# Patient Record
Sex: Male | Born: 1985 | Race: White | Hispanic: No | Marital: Single | Smoking: Never smoker
Health system: Southern US, Community
[De-identification: ages and names within clinical notes are randomized; demographics above are authoritative.]

## PROBLEM LIST (undated history)

## (undated) DIAGNOSIS — G809 Cerebral palsy, unspecified: Secondary | ICD-10-CM

## (undated) DIAGNOSIS — R569 Unspecified convulsions: Secondary | ICD-10-CM

## (undated) HISTORY — PX: ABDOMINAL SURGERY: SHX537

---
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
## 2008-05-26 ENCOUNTER — Emergency Department: Payer: Self-pay | Admitting: Emergency Medicine

## 2008-06-17 ENCOUNTER — Ambulatory Visit: Payer: Self-pay | Admitting: Neurology

## 2008-07-08 ENCOUNTER — Ambulatory Visit: Payer: Self-pay | Admitting: Internal Medicine

## 2008-07-21 ENCOUNTER — Encounter: Payer: Self-pay | Admitting: Family Medicine

## 2008-08-04 ENCOUNTER — Encounter: Payer: Self-pay | Admitting: Family Medicine

## 2012-12-11 ENCOUNTER — Ambulatory Visit: Payer: Self-pay | Admitting: Neurology

## 2013-10-13 ENCOUNTER — Ambulatory Visit: Payer: Self-pay | Admitting: Neurology

## 2015-05-11 DIAGNOSIS — H903 Sensorineural hearing loss, bilateral: Secondary | ICD-10-CM | POA: Diagnosis not present

## 2015-05-20 DIAGNOSIS — R42 Dizziness and giddiness: Secondary | ICD-10-CM | POA: Diagnosis not present

## 2015-05-25 DIAGNOSIS — G40909 Epilepsy, unspecified, not intractable, without status epilepticus: Secondary | ICD-10-CM | POA: Diagnosis not present

## 2015-05-25 DIAGNOSIS — R42 Dizziness and giddiness: Secondary | ICD-10-CM | POA: Diagnosis not present

## 2015-05-25 DIAGNOSIS — H8111 Benign paroxysmal vertigo, right ear: Secondary | ICD-10-CM | POA: Diagnosis not present

## 2015-05-31 DIAGNOSIS — F0632 Mood disorder due to known physiological condition with major depressive-like episode: Secondary | ICD-10-CM | POA: Diagnosis not present

## 2015-06-22 DIAGNOSIS — H6982 Other specified disorders of Eustachian tube, left ear: Secondary | ICD-10-CM | POA: Diagnosis not present

## 2015-06-27 DIAGNOSIS — Z0000 Encounter for general adult medical examination without abnormal findings: Secondary | ICD-10-CM | POA: Diagnosis not present

## 2015-06-27 DIAGNOSIS — J4540 Moderate persistent asthma, uncomplicated: Secondary | ICD-10-CM | POA: Diagnosis not present

## 2015-06-27 DIAGNOSIS — J309 Allergic rhinitis, unspecified: Secondary | ICD-10-CM | POA: Diagnosis not present

## 2015-06-28 DIAGNOSIS — F0632 Mood disorder due to known physiological condition with major depressive-like episode: Secondary | ICD-10-CM | POA: Diagnosis not present

## 2015-07-10 DIAGNOSIS — J45901 Unspecified asthma with (acute) exacerbation: Secondary | ICD-10-CM | POA: Diagnosis not present

## 2015-07-10 DIAGNOSIS — R69 Illness, unspecified: Secondary | ICD-10-CM | POA: Diagnosis not present

## 2015-07-26 DIAGNOSIS — F0632 Mood disorder due to known physiological condition with major depressive-like episode: Secondary | ICD-10-CM | POA: Diagnosis not present

## 2015-07-27 DIAGNOSIS — J4540 Moderate persistent asthma, uncomplicated: Secondary | ICD-10-CM | POA: Diagnosis not present

## 2015-07-27 DIAGNOSIS — J309 Allergic rhinitis, unspecified: Secondary | ICD-10-CM | POA: Diagnosis not present

## 2015-07-27 DIAGNOSIS — H1045 Other chronic allergic conjunctivitis: Secondary | ICD-10-CM | POA: Diagnosis not present

## 2015-07-27 DIAGNOSIS — J3089 Other allergic rhinitis: Secondary | ICD-10-CM | POA: Diagnosis not present

## 2015-07-28 DIAGNOSIS — R079 Chest pain, unspecified: Secondary | ICD-10-CM | POA: Diagnosis not present

## 2015-07-28 DIAGNOSIS — R05 Cough: Secondary | ICD-10-CM | POA: Diagnosis not present

## 2015-08-07 DIAGNOSIS — H8110 Benign paroxysmal vertigo, unspecified ear: Secondary | ICD-10-CM | POA: Diagnosis not present

## 2015-08-07 DIAGNOSIS — S8002XA Contusion of left knee, initial encounter: Secondary | ICD-10-CM | POA: Diagnosis not present

## 2015-08-23 DIAGNOSIS — F0632 Mood disorder due to known physiological condition with major depressive-like episode: Secondary | ICD-10-CM | POA: Diagnosis not present

## 2015-09-06 DIAGNOSIS — H53002 Unspecified amblyopia, left eye: Secondary | ICD-10-CM | POA: Diagnosis not present

## 2015-09-18 DIAGNOSIS — G40909 Epilepsy, unspecified, not intractable, without status epilepticus: Secondary | ICD-10-CM | POA: Diagnosis not present

## 2015-09-18 DIAGNOSIS — Z79899 Other long term (current) drug therapy: Secondary | ICD-10-CM | POA: Diagnosis not present

## 2015-09-18 DIAGNOSIS — R42 Dizziness and giddiness: Secondary | ICD-10-CM | POA: Diagnosis not present

## 2015-09-18 DIAGNOSIS — R413 Other amnesia: Secondary | ICD-10-CM | POA: Diagnosis not present

## 2015-09-20 DIAGNOSIS — F0632 Mood disorder due to known physiological condition with major depressive-like episode: Secondary | ICD-10-CM | POA: Diagnosis not present

## 2015-09-27 DIAGNOSIS — F0632 Mood disorder due to known physiological condition with major depressive-like episode: Secondary | ICD-10-CM | POA: Diagnosis not present

## 2015-10-11 DIAGNOSIS — J4520 Mild intermittent asthma, uncomplicated: Secondary | ICD-10-CM | POA: Diagnosis not present

## 2015-10-11 DIAGNOSIS — R0602 Shortness of breath: Secondary | ICD-10-CM | POA: Diagnosis not present

## 2015-11-01 DIAGNOSIS — B351 Tinea unguium: Secondary | ICD-10-CM | POA: Diagnosis not present

## 2015-11-01 DIAGNOSIS — M79675 Pain in left toe(s): Secondary | ICD-10-CM | POA: Diagnosis not present

## 2015-11-01 DIAGNOSIS — M79674 Pain in right toe(s): Secondary | ICD-10-CM | POA: Diagnosis not present

## 2016-01-25 DIAGNOSIS — J3089 Other allergic rhinitis: Secondary | ICD-10-CM | POA: Diagnosis not present

## 2016-01-25 DIAGNOSIS — J4540 Moderate persistent asthma, uncomplicated: Secondary | ICD-10-CM | POA: Diagnosis not present

## 2016-01-25 DIAGNOSIS — H1045 Other chronic allergic conjunctivitis: Secondary | ICD-10-CM | POA: Diagnosis not present

## 2016-02-01 DIAGNOSIS — M79674 Pain in right toe(s): Secondary | ICD-10-CM | POA: Diagnosis not present

## 2016-02-01 DIAGNOSIS — M79675 Pain in left toe(s): Secondary | ICD-10-CM | POA: Diagnosis not present

## 2016-02-01 DIAGNOSIS — B351 Tinea unguium: Secondary | ICD-10-CM | POA: Diagnosis not present

## 2016-02-20 DIAGNOSIS — Z8639 Personal history of other endocrine, nutritional and metabolic disease: Secondary | ICD-10-CM | POA: Diagnosis not present

## 2016-02-20 DIAGNOSIS — R899 Unspecified abnormal finding in specimens from other organs, systems and tissues: Secondary | ICD-10-CM | POA: Diagnosis not present

## 2016-03-31 ENCOUNTER — Emergency Department
Admission: EM | Admit: 2016-03-31 | Discharge: 2016-03-31 | Disposition: A | Discharge status: Home / Self Care | Payer: 59 | Attending: Emergency Medicine | Admitting: Emergency Medicine

## 2016-03-31 ENCOUNTER — Emergency Department
Admission: EM | Admit: 2016-03-31 | Discharge: 2016-03-31 | Disposition: A | Discharge status: Home / Self Care | Payer: Self-pay

## 2016-03-31 ENCOUNTER — Encounter: Payer: Self-pay | Admitting: Emergency Medicine

## 2016-03-31 DIAGNOSIS — R55 Syncope and collapse: Secondary | ICD-10-CM

## 2016-03-31 DIAGNOSIS — Z79899 Other long term (current) drug therapy: Secondary | ICD-10-CM | POA: Diagnosis not present

## 2016-03-31 DIAGNOSIS — E860 Dehydration: Secondary | ICD-10-CM

## 2016-03-31 DIAGNOSIS — G809 Cerebral palsy, unspecified: Secondary | ICD-10-CM | POA: Insufficient documentation

## 2016-03-31 HISTORY — DX: Unspecified convulsions: R56.9

## 2016-03-31 HISTORY — DX: Cerebral palsy, unspecified: G80.9

## 2016-03-31 LAB — URINALYSIS COMPLETE WITH MICROSCOPIC (ARMC ONLY)
BILIRUBIN URINE: NEGATIVE
Bacteria, UA: NONE SEEN
Glucose, UA: NEGATIVE mg/dL
HGB URINE DIPSTICK: NEGATIVE
KETONES UR: NEGATIVE mg/dL
LEUKOCYTES UA: NEGATIVE
NITRITE: NEGATIVE
PH: 6 (ref 5.0–8.0)
Protein, ur: 30 mg/dL — AB
SPECIFIC GRAVITY, URINE: 1.026 (ref 1.005–1.030)

## 2016-03-31 LAB — CBC
HCT: 56.7 % — ABNORMAL HIGH (ref 40.0–52.0)
HCT: 49.7 % (ref 40.0–52.0)
HEMOGLOBIN: 19.6 g/dL — AB (ref 13.0–18.0)
HEMOGLOBIN: 17 g/dL (ref 13.0–18.0)
MCH: 31.8 pg (ref 26.0–34.0)
MCH: 31.6 pg (ref 26.0–34.0)
MCHC: 34.5 g/dL (ref 32.0–36.0)
MCHC: 34.3 g/dL (ref 32.0–36.0)
MCV: 92.2 fL (ref 80.0–100.0)
MCV: 92.1 fL (ref 80.0–100.0)
PLATELETS: 185 10*3/uL (ref 150–440)
Platelets: 180 10*3/uL (ref 150–440)
RBC: 6.15 MIL/uL — AB (ref 4.40–5.90)
RBC: 5.39 MIL/uL (ref 4.40–5.90)
RDW: 13 % (ref 11.5–14.5)
RDW: 12.9 % (ref 11.5–14.5)
WBC: 9.6 10*3/uL (ref 3.8–10.6)
WBC: 10.7 10*3/uL — ABNORMAL HIGH (ref 3.8–10.6)

## 2016-03-31 LAB — BASIC METABOLIC PANEL
ANION GAP: 8 (ref 5–15)
BUN: 15 mg/dL (ref 6–20)
CALCIUM: 9.2 mg/dL (ref 8.9–10.3)
CO2: 25 mmol/L (ref 22–32)
CREATININE: 1.19 mg/dL (ref 0.61–1.24)
Chloride: 100 mmol/L — ABNORMAL LOW (ref 101–111)
Glucose, Bld: 110 mg/dL — ABNORMAL HIGH (ref 65–99)
Potassium: 3.8 mmol/L (ref 3.5–5.1)
SODIUM: 133 mmol/L — AB (ref 135–145)

## 2016-03-31 MED ORDER — SODIUM CHLORIDE 0.9 % IV BOLUS (SEPSIS)
1000.0000 mL | Freq: Once | INTRAVENOUS | Status: AC
  Administered 2016-03-31: 1000 mL via INTRAVENOUS

## 2016-03-31 NOTE — ED Notes (Signed)
Pt became diaphoretic and pale and near syncope while this nurse drawing blood. Pt given cold washcloth and monitored for 10 minutes then moved into the subwait area into a recliner and his mother is with him.

## 2016-03-31 NOTE — ED Provider Notes (Signed)
[facility] Emergency Department Provider Note   ____________________________________________   I have reviewed the triage vital signs and the nursing notes.   HISTORY  Chief Complaint Diarrhea and Near Syncope   History limited by: Not Limited   HPI Bradley Butler is a 30 y.o. male who presents to the emergency department today after an episode of dizziness and near syncope. The patient states that earlier today he hadroughly 10 episodes of diarrhea. He did have some nausea associated with the bowel movements however is unable to vomit secondary a fundoplication. Apparently many people at his household have also had similar symptoms of diarrhea and vomiting. Patient currently does not have any symptoms of dizziness.    Past Medical History:  Diagnosis Date  . CP (cerebral palsy) (HCC)   . Seizures (HCC)     There are no active problems to display for this patient.   Past Surgical History:  Procedure Laterality Date  . ABDOMINAL SURGERY      Prior to Admission medications   Not on File    Allergies Levaquin [levofloxacin in d5w]  History reviewed. No pertinent family history.  Social History Social History  Substance Use Topics  . Smoking status: Never Smoker  . Smokeless tobacco: Never Used  . Alcohol use No    Review of Systems  Constitutional: Negative for fever. Cardiovascular: Negative for chest pain. Respiratory: Negative for shortness of breath. Gastrointestinal: Negative for abdominal pain, vomiting and diarrhea. Genitourinary: Negative for dysuria. Musculoskeletal: Negative for back pain. Skin: Negative for rash. Neurological: Negative for headaches, focal weakness or numbness.  10-point ROS otherwise negative.  ____________________________________________   PHYSICAL EXAM:  VITAL SIGNS: ED Triage Vitals  Enc Vitals Group     BP 03/31/16 2021 128/89     Pulse Rate 03/31/16 2021 76     Resp 03/31/16 2021 14      Temp 03/31/16 2021 97.3 F (36.3 C)     Temp Source 03/31/16 2021 Oral     SpO2 03/31/16 2021 99 %     Weight 03/31/16 2021 135 lb (61.2 kg)     Height 03/31/16 2021 5\' 3"  (1.6 m)     Head Circumference --      Peak Flow --      Pain Score 03/31/16 2103 0   Constitutional: Alert and oriented. Well appearing and in no distress. Eyes: Conjunctivae are normal. Normal extraocular movements. ENT   Head: Normocephalic and atraumatic.   Nose: No congestion/rhinnorhea.   Mouth/Throat: Mucous membranes are moist.   Neck: No stridor. Hematological/Lymphatic/Immunilogical: No cervical lymphadenopathy. Cardiovascular: Normal rate, regular rhythm.  No murmurs, rubs, or gallops. Respiratory: Normal respiratory effort without tachypnea nor retractions. Breath sounds are clear and equal bilaterally. No wheezes/rales/rhonchi. Gastrointestinal: Soft and nontender. No distention.  Genitourinary: Deferred Musculoskeletal: Normal range of motion in all extremities. No lower extremity edema. Neurologic:  Normal speech and language. No gross focal neurologic deficits are appreciated.  Skin:  Skin is warm, dry and intact. No rash noted. Psychiatric: Mood and affect are normal. Speech and behavior are normal. Patient exhibits appropriate insight and judgment.  ____________________________________________    LABS (pertinent positives/negatives)  Labs Reviewed  BASIC METABOLIC PANEL - Abnormal; Notable for the following:       Result Value   Sodium 133 (*)    Chloride 100 (*)    Glucose, Bld 110 (*)    All other components within normal limits  CBC - Abnormal; Notable for the following:  RBC 6.15 (*)    Hemoglobin 19.6 (*)    HCT 56.7 (*)    All other components within normal limits  URINALYSIS COMPLETEWITH MICROSCOPIC ([facility] ONLY) - Abnormal; Notable for the following:    Color, Urine YELLOW (*)    APPearance CLEAR (*)    Protein, ur 30 (*)    Squamous Epithelial / LPF 0-5 (*)     All other components within normal limits  CBC - Abnormal; Notable for the following:    WBC 10.7 (*)    All other components within normal limits  CBG MONITORING, ED     ____________________________________________   EKG  None  ____________________________________________    RADIOLOGY  None  ____________________________________________   PROCEDURES  Procedures  ____________________________________________   INITIAL IMPRESSION / ASSESSMENT AND PLAN / ED COURSE  Pertinent labs & imaging results that were available during my care of the patient were reviewed by me and considered in my medical decision making (see chart for details).  Patient presented to the emergency department today because of concerns for a near syncopal episode and possible dehydration. Initial blood work does appear to be hemoconcentrated. Patient was given 2 L. On recheck hemoglobin now normal. Patient stated he felt better. Think dehydration likely secondary to the patient's diarrhea which appears to be gastritis neuritis considering the multiple members of his household have also had similar symptoms. ____________________________________________   FINAL CLINICAL IMPRESSION(S) / ED DIAGNOSES  Final diagnoses:  Dehydration  Near syncope     Note: This dictation was prepared with Dragon dictation. Any transcriptional errors that result from this process are unintentional    Phineas SemenGraydon Shawntell Dixson, MD 03/31/16 [phone]

## 2016-03-31 NOTE — Discharge Instructions (Signed)
Please seek medical attention for any high fevers, chest pain, shortness of breath, change in behavior, persistent vomiting, bloody stool or any other new or concerning symptoms.  

## 2016-03-31 NOTE — ED Triage Notes (Signed)
States having diarrhea, feels like he has a sinus infection and states his cousin brought him in for near syncope (clammy and states the room was spinning).

## 2016-05-13 DIAGNOSIS — B351 Tinea unguium: Secondary | ICD-10-CM | POA: Diagnosis not present

## 2016-05-13 DIAGNOSIS — M79674 Pain in right toe(s): Secondary | ICD-10-CM | POA: Diagnosis not present

## 2016-05-13 DIAGNOSIS — M79675 Pain in left toe(s): Secondary | ICD-10-CM | POA: Diagnosis not present

## 2016-06-10 DIAGNOSIS — J069 Acute upper respiratory infection, unspecified: Secondary | ICD-10-CM | POA: Diagnosis not present

## 2016-06-20 DIAGNOSIS — E559 Vitamin D deficiency, unspecified: Secondary | ICD-10-CM | POA: Diagnosis not present

## 2016-06-27 DIAGNOSIS — M25571 Pain in right ankle and joints of right foot: Secondary | ICD-10-CM | POA: Diagnosis not present

## 2016-06-27 DIAGNOSIS — R69 Illness, unspecified: Secondary | ICD-10-CM | POA: Diagnosis not present

## 2016-06-27 DIAGNOSIS — Z0000 Encounter for general adult medical examination without abnormal findings: Secondary | ICD-10-CM | POA: Diagnosis not present

## 2016-07-23 DIAGNOSIS — S9001XA Contusion of right ankle, initial encounter: Secondary | ICD-10-CM | POA: Diagnosis not present

## 2016-07-23 DIAGNOSIS — M25571 Pain in right ankle and joints of right foot: Secondary | ICD-10-CM | POA: Diagnosis not present

## 2016-07-23 DIAGNOSIS — M76821 Posterior tibial tendinitis, right leg: Secondary | ICD-10-CM | POA: Diagnosis not present

## 2016-08-16 DIAGNOSIS — L739 Follicular disorder, unspecified: Secondary | ICD-10-CM | POA: Diagnosis not present

## 2016-09-04 DIAGNOSIS — H6502 Acute serous otitis media, left ear: Secondary | ICD-10-CM | POA: Diagnosis not present

## 2016-09-04 DIAGNOSIS — H6092 Unspecified otitis externa, left ear: Secondary | ICD-10-CM | POA: Diagnosis not present

## 2016-09-17 DIAGNOSIS — R413 Other amnesia: Secondary | ICD-10-CM | POA: Diagnosis not present

## 2016-09-17 DIAGNOSIS — G44201 Tension-type headache, unspecified, intractable: Secondary | ICD-10-CM | POA: Diagnosis not present

## 2016-09-17 DIAGNOSIS — G40909 Epilepsy, unspecified, not intractable, without status epilepticus: Secondary | ICD-10-CM | POA: Diagnosis not present

## 2016-09-17 DIAGNOSIS — R42 Dizziness and giddiness: Secondary | ICD-10-CM | POA: Diagnosis not present

## 2016-09-18 ENCOUNTER — Other Ambulatory Visit: Payer: Self-pay | Admitting: Nurse Practitioner

## 2016-09-18 DIAGNOSIS — R42 Dizziness and giddiness: Secondary | ICD-10-CM

## 2016-09-18 DIAGNOSIS — R413 Other amnesia: Secondary | ICD-10-CM

## 2016-09-18 DIAGNOSIS — G44201 Tension-type headache, unspecified, intractable: Secondary | ICD-10-CM

## 2016-10-04 ENCOUNTER — Ambulatory Visit
Admission: RE | Admit: 2016-10-04 | Discharge: 2016-10-04 | Disposition: A | Discharge status: Home / Self Care | Payer: 59 | Source: Ambulatory Visit | Attending: Nurse Practitioner | Admitting: Nurse Practitioner

## 2016-10-04 DIAGNOSIS — R42 Dizziness and giddiness: Secondary | ICD-10-CM | POA: Insufficient documentation

## 2016-10-04 DIAGNOSIS — G44201 Tension-type headache, unspecified, intractable: Secondary | ICD-10-CM | POA: Diagnosis not present

## 2016-10-04 DIAGNOSIS — G801 Spastic diplegic cerebral palsy: Secondary | ICD-10-CM | POA: Diagnosis not present

## 2016-10-04 DIAGNOSIS — R569 Unspecified convulsions: Secondary | ICD-10-CM | POA: Diagnosis not present

## 2016-10-04 DIAGNOSIS — R413 Other amnesia: Secondary | ICD-10-CM | POA: Diagnosis not present

## 2016-10-04 DIAGNOSIS — R55 Syncope and collapse: Secondary | ICD-10-CM | POA: Diagnosis not present

## 2016-10-04 MED ORDER — GADOBENATE DIMEGLUMINE 529 MG/ML IV SOLN
15.0000 mL | Freq: Once | INTRAVENOUS | Status: AC | PRN
  Administered 2016-10-04: 12 mL via INTRAVENOUS

## 2016-11-19 DIAGNOSIS — R413 Other amnesia: Secondary | ICD-10-CM | POA: Diagnosis not present

## 2016-11-19 DIAGNOSIS — G44201 Tension-type headache, unspecified, intractable: Secondary | ICD-10-CM | POA: Diagnosis not present

## 2016-11-19 DIAGNOSIS — R42 Dizziness and giddiness: Secondary | ICD-10-CM | POA: Diagnosis not present

## 2016-11-19 DIAGNOSIS — R569 Unspecified convulsions: Secondary | ICD-10-CM | POA: Diagnosis not present

## 2017-02-01 DIAGNOSIS — J069 Acute upper respiratory infection, unspecified: Secondary | ICD-10-CM | POA: Diagnosis not present

## 2017-02-14 DIAGNOSIS — J209 Acute bronchitis, unspecified: Secondary | ICD-10-CM | POA: Diagnosis not present

## 2017-02-14 DIAGNOSIS — J019 Acute sinusitis, unspecified: Secondary | ICD-10-CM | POA: Diagnosis not present

## 2017-02-14 DIAGNOSIS — B9689 Other specified bacterial agents as the cause of diseases classified elsewhere: Secondary | ICD-10-CM | POA: Diagnosis not present

## 2017-02-21 DIAGNOSIS — Z23 Encounter for immunization: Secondary | ICD-10-CM | POA: Diagnosis not present

## 2017-04-11 ENCOUNTER — Other Ambulatory Visit: Payer: Self-pay

## 2017-04-11 ENCOUNTER — Emergency Department: Payer: 59

## 2017-04-11 ENCOUNTER — Encounter: Payer: Self-pay | Admitting: Emergency Medicine

## 2017-04-11 ENCOUNTER — Emergency Department
Admission: EM | Admit: 2017-04-11 | Discharge: 2017-04-11 | Disposition: A | Discharge status: Home / Self Care | Payer: 59 | Attending: Emergency Medicine | Admitting: Emergency Medicine

## 2017-04-11 DIAGNOSIS — R569 Unspecified convulsions: Secondary | ICD-10-CM | POA: Diagnosis not present

## 2017-04-11 DIAGNOSIS — S3992XA Unspecified injury of lower back, initial encounter: Secondary | ICD-10-CM | POA: Diagnosis not present

## 2017-04-11 DIAGNOSIS — M545 Low back pain, unspecified: Secondary | ICD-10-CM

## 2017-04-11 DIAGNOSIS — G801 Spastic diplegic cerebral palsy: Secondary | ICD-10-CM | POA: Diagnosis not present

## 2017-04-11 DIAGNOSIS — R413 Other amnesia: Secondary | ICD-10-CM | POA: Diagnosis not present

## 2017-04-11 DIAGNOSIS — M7918 Myalgia, other site: Secondary | ICD-10-CM | POA: Insufficient documentation

## 2017-04-11 DIAGNOSIS — Z79899 Other long term (current) drug therapy: Secondary | ICD-10-CM | POA: Diagnosis not present

## 2017-04-11 DIAGNOSIS — R42 Dizziness and giddiness: Secondary | ICD-10-CM | POA: Diagnosis not present

## 2017-04-11 MED ORDER — CYCLOBENZAPRINE HCL 10 MG PO TABS: 10.0000 mg | ORAL_TABLET | Freq: Once | ORAL | Status: DC

## 2017-04-11 MED ORDER — CYCLOBENZAPRINE HCL 10 MG PO TABS: 10.0000 mg | ORAL_TABLET | Freq: Three times a day (TID) | ORAL | 0 refills | Status: DC | PRN

## 2017-04-11 MED ORDER — KETOROLAC TROMETHAMINE 30 MG/ML IJ SOLN
30.0000 mg | Freq: Once | INTRAMUSCULAR | Status: AC
  Administered 2017-04-11: 30 mg via INTRAMUSCULAR
  Filled 2017-04-11: qty 1

## 2017-04-11 MED ORDER — NAPROXEN 500 MG PO TABS: 500.0000 mg | ORAL_TABLET | Freq: Once | ORAL | Status: DC

## 2017-04-11 MED ORDER — ORPHENADRINE CITRATE 30 MG/ML IJ SOLN
60.0000 mg | Freq: Two times a day (BID) | INTRAMUSCULAR | Status: DC
  Administered 2017-04-11: 60 mg via INTRAMUSCULAR
  Filled 2017-04-11: qty 2

## 2017-04-11 MED ORDER — NAPROXEN 500 MG PO TABS: 500.0000 mg | ORAL_TABLET | Freq: Two times a day (BID) | ORAL | 0 refills | Status: DC
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
## 2017-05-13 DIAGNOSIS — J069 Acute upper respiratory infection, unspecified: Secondary | ICD-10-CM | POA: Diagnosis not present

## 2017-06-23 DIAGNOSIS — R748 Abnormal levels of other serum enzymes: Secondary | ICD-10-CM | POA: Diagnosis not present

## 2017-06-23 DIAGNOSIS — Z0000 Encounter for general adult medical examination without abnormal findings: Secondary | ICD-10-CM | POA: Diagnosis not present

## 2017-06-30 DIAGNOSIS — Z0000 Encounter for general adult medical examination without abnormal findings: Secondary | ICD-10-CM | POA: Diagnosis not present

## 2017-06-30 DIAGNOSIS — G40909 Epilepsy, unspecified, not intractable, without status epilepticus: Secondary | ICD-10-CM | POA: Diagnosis not present

## 2017-06-30 DIAGNOSIS — R945 Abnormal results of liver function studies: Secondary | ICD-10-CM | POA: Diagnosis not present

## 2017-07-11 DIAGNOSIS — R569 Unspecified convulsions: Secondary | ICD-10-CM | POA: Diagnosis not present

## 2017-07-11 DIAGNOSIS — G801 Spastic diplegic cerebral palsy: Secondary | ICD-10-CM | POA: Diagnosis not present

## 2017-07-23 DIAGNOSIS — R748 Abnormal levels of other serum enzymes: Secondary | ICD-10-CM | POA: Diagnosis not present

## 2017-08-21 DIAGNOSIS — R945 Abnormal results of liver function studies: Secondary | ICD-10-CM | POA: Diagnosis not present

## 2017-09-07 DIAGNOSIS — B9689 Other specified bacterial agents as the cause of diseases classified elsewhere: Secondary | ICD-10-CM | POA: Diagnosis not present

## 2017-09-07 DIAGNOSIS — J019 Acute sinusitis, unspecified: Secondary | ICD-10-CM | POA: Diagnosis not present

## 2017-09-07 DIAGNOSIS — J209 Acute bronchitis, unspecified: Secondary | ICD-10-CM | POA: Diagnosis not present

## 2017-09-30 IMAGING — MR MR HEAD WO/W CM
13 series · 48 of 48 positions shown · IV contrast (multihance)
Comparison: CT head without contrast 10/13/2013. MRI brain
10/11/2012.

CLINICAL DATA: Dizzy Abedinago with loss of consciousness 2 weeks ago.
Previous history of seizure activity. Cerebral palsy.

EXAM:
MRI HEAD WITHOUT AND WITH CONTRAST
TECHNIQUE: Multiplanar, multiecho pulse sequences of the brain and surrounding
structures were obtained without and with intravenous contrast.
CONTRAST:  12mL MULTIHANCE GADOBENATE DIMEGLUMINE 529 MG/ML IV SOLN

[Series 2: T1 · sagittal · 5.0mm · 0.45mm/px · 1 of 23 slices shown (1 of 2)]
[im 1/23]
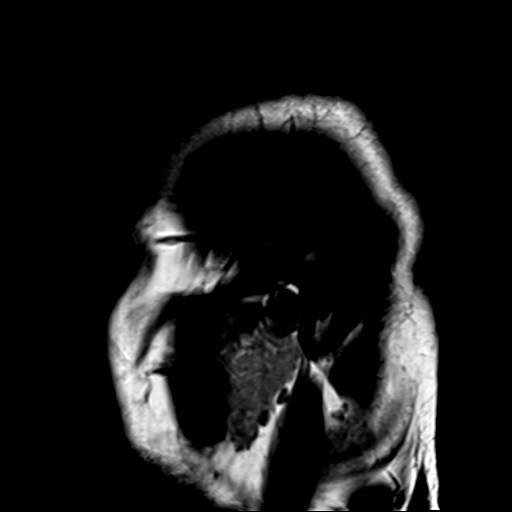

[Series 4: DWI · axial · 3.0mm · 1.80mm/px · z∈[-15,+147]mm · 3 of 55 slices shown (1 of 2)]
[im 1/55]
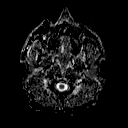
[im 28/55]
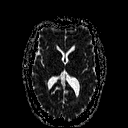
[im 55/55]
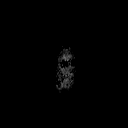

[Series 6: DWI · coronal · 3.0mm · 1.80mm/px · 3 of 45 slices shown (2 of 2)]
[im 1/45]
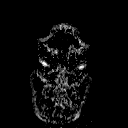
[im 23/45]
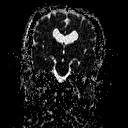
[im 45/45]
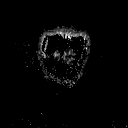

[Series 7: T2 · axial · 5.0mm · 0.60mm/px · z∈[-13,+143]mm · 2 of 25 slices shown (1 of 3)]
[im 1/25]
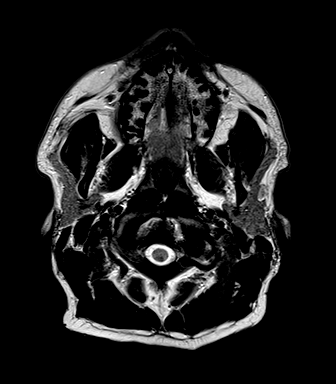
[im 25/25]
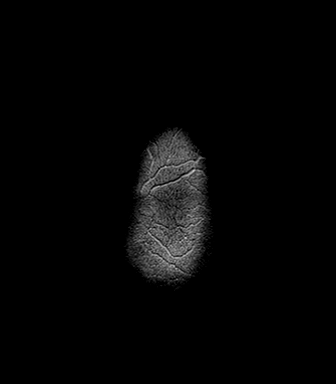

[Series 8: FLAIR · axial · 3.0mm · 0.45mm/px · z∈[-13,+143]mm · 3 of 53 slices shown]
[im 1/53]
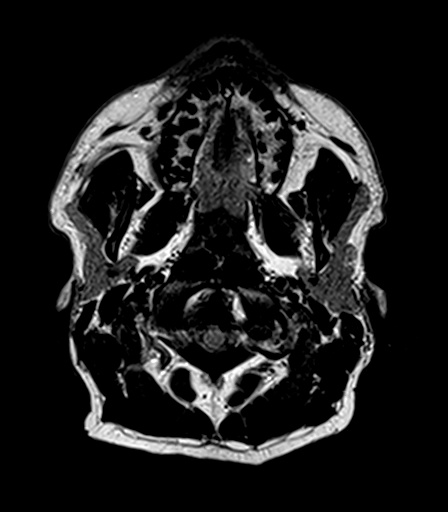
[im 27/53]
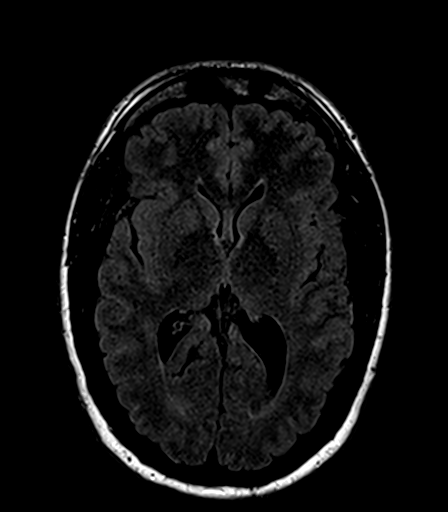
[im 53/53]
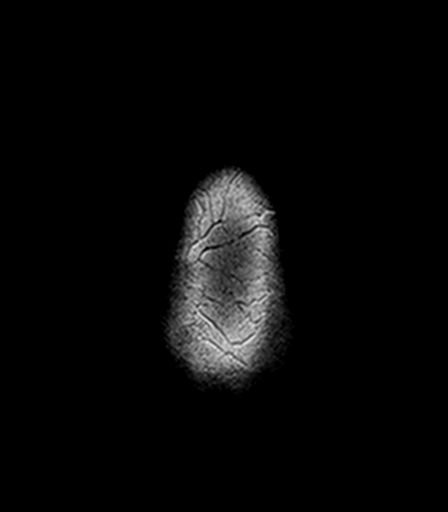

[Series 9: T2 · axial · 5.0mm · 0.45mm/px · z∈[-13,+143]mm · 2 of 25 slices shown (2 of 3)]
[im 1/25]
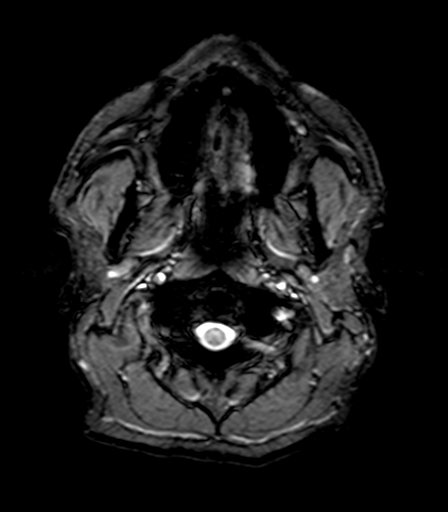
[im 25/25]
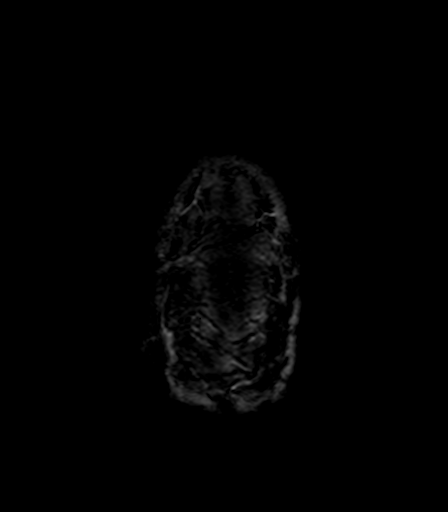

[Series 10: T1 · axial · 1.0mm · 1.00mm/px · z∈[-22,+153]mm · 11 of 176 slices shown (2 of 2)]
[im 1/176]
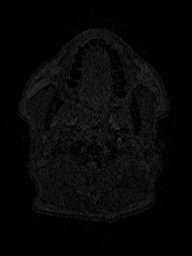
[im 18/176]
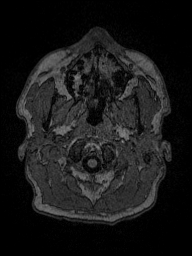
[im 36/176]
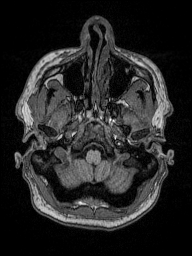
[im 53/176]
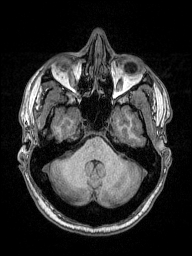
[im 71/176]
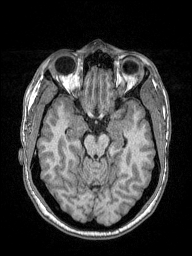
[im 88/176]
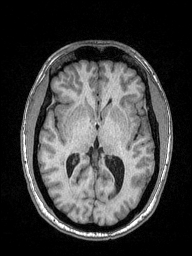
[im 106/176]
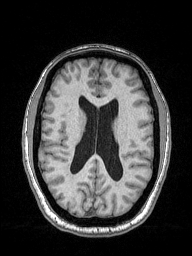
[im 123/176]
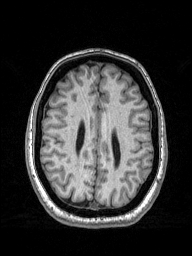
[im 141/176]
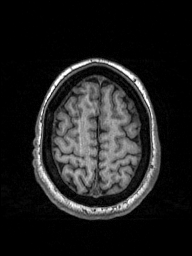
[im 158/176]
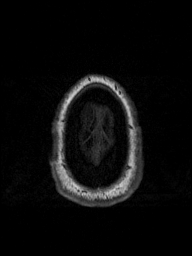
[im 176/176]
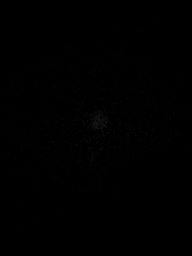

[Series 11: T2 · coronal · 4.0mm · 0.60mm/px · 2 of 31 slices shown (3 of 3)]
[im 1/31]
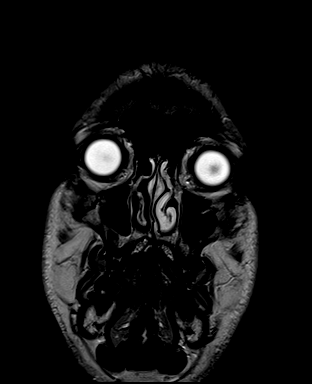
[im 31/31]
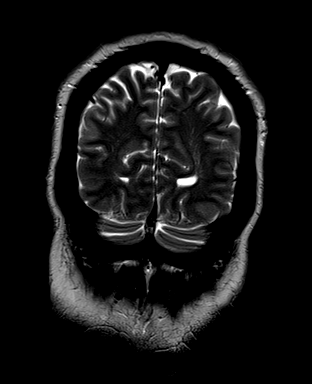

[Series 12: T2 post-contrast · coronal · 5.0mm · 0.49mm/px · 2 of 27 slices shown]
[im 1/27]
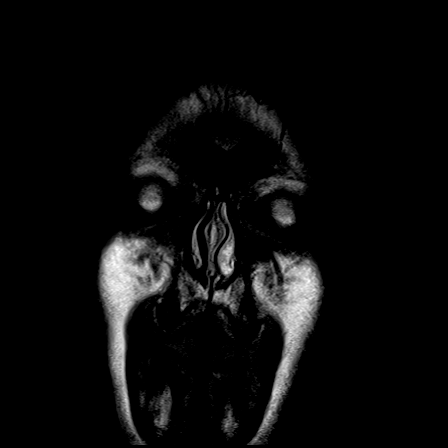
[im 27/27]
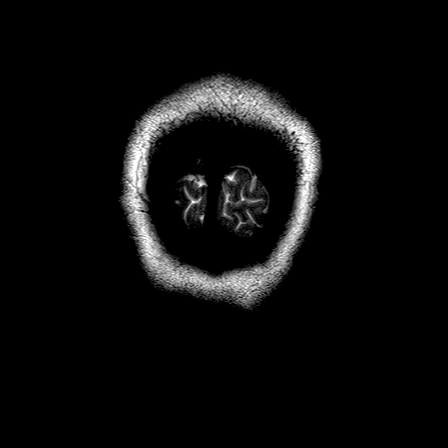

[Series 13: T1 post-contrast · axial · 1.0mm · 1.00mm/px · z∈[-22,+153]mm · 11 of 176 slices shown (1 of 2)]
[im 1/176]
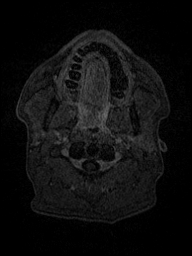
[im 18/176]
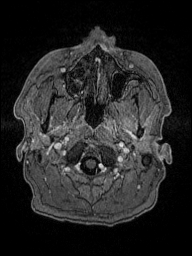
[im 36/176]
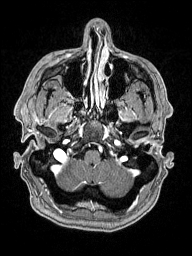
[im 53/176]
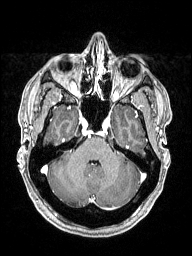
[im 71/176]
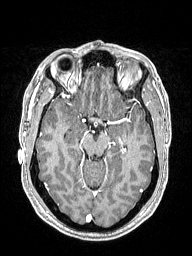
[im 88/176]
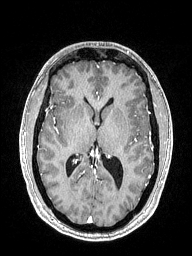
[im 106/176]
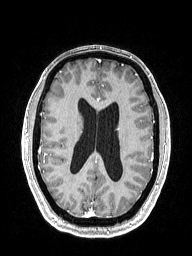
[im 123/176]
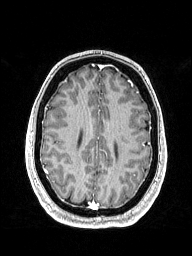
[im 141/176]
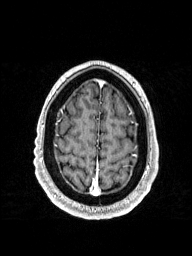
[im 158/176]
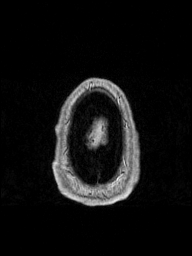
[im 176/176]
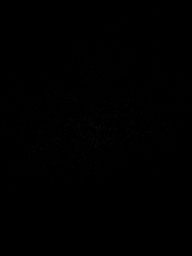

[Series 14: T1 post-contrast · coronal · 5.0mm · 0.43mm/px · 2 of 27 slices shown (2 of 2)]
[im 1/27]
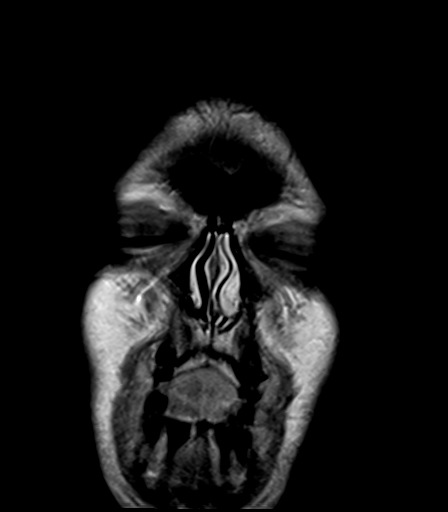
[im 27/27]
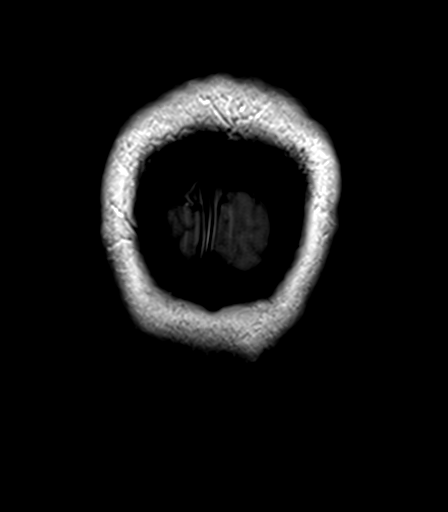

[Series 100: ax (id) · axial · 3.0mm · 1.80mm/px · z∈[-15,+147]mm · 3 of 55 slices shown]
[im 1/55]
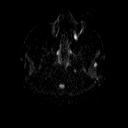
[im 28/55]
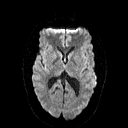
[im 55/55]
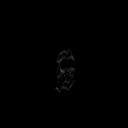

[Series 101: cor (id) · coronal · 3.0mm · 1.80mm/px · 3 of 45 slices shown]
[im 1/45]
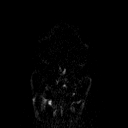
[im 23/45]
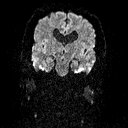
[im 45/45]
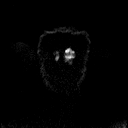

[48 of 48 positions shown; findings below may reference images not displayed]

FINDINGS: Brain: Mild chronic white matter volume loss posteriorly with ex
vacuo dilation of the lateral ventricles is stable. There is also
stable thinning of the posterior corpus callosum. No acute infarct,
hemorrhage, or mass lesion is present. No other significant white
matter disease is present. Internal auditory canals are within
normal limits bilaterally. The brainstem and cerebellum are normal.

Dedicated imaging of the temporal lobes demonstrates symmetric size
and signal of the hippocampal structures. No focal mass lesion is
present.

The postcontrast images demonstrate no pathologic enhancement.

Vascular: Flow is present in the major intracranial arteries.

Skull and upper cervical spine: The skullbase is within normal
limits. Midline sagittal structures are unremarkable. Craniocervical
junction is normal.

Sinuses/Orbits: The paranasal sinuses and mastoid air cells are
clear the the globes and orbits are within normal limits.
IMPRESSION: 1. No acute intracranial abnormality or significant interval change.
2. Stable posterior chronic white matter loss and thinning of the
corpus callosum.
3. No acute or focal abnormality to explain seizures.

## 2017-12-17 DIAGNOSIS — J45909 Unspecified asthma, uncomplicated: Secondary | ICD-10-CM | POA: Diagnosis not present

## 2017-12-17 DIAGNOSIS — R0602 Shortness of breath: Secondary | ICD-10-CM | POA: Diagnosis not present

## 2018-03-23 DIAGNOSIS — G801 Spastic diplegic cerebral palsy: Secondary | ICD-10-CM | POA: Diagnosis not present

## 2018-03-23 DIAGNOSIS — R569 Unspecified convulsions: Secondary | ICD-10-CM | POA: Diagnosis not present

## 2018-03-23 DIAGNOSIS — R413 Other amnesia: Secondary | ICD-10-CM | POA: Diagnosis not present

## 2018-03-23 DIAGNOSIS — G253 Myoclonus: Secondary | ICD-10-CM | POA: Diagnosis not present

## 2018-07-14 ENCOUNTER — Other Ambulatory Visit: Payer: Self-pay | Admitting: Gastroenterology

## 2018-07-14 DIAGNOSIS — R7989 Other specified abnormal findings of blood chemistry: Secondary | ICD-10-CM

## 2018-07-14 DIAGNOSIS — R7401 Elevation of levels of liver transaminase levels: Secondary | ICD-10-CM

## 2018-07-14 DIAGNOSIS — R945 Abnormal results of liver function studies: Principal | ICD-10-CM

## 2018-07-14 DIAGNOSIS — R740 Nonspecific elevation of levels of transaminase and lactic acid dehydrogenase [LDH]: Secondary | ICD-10-CM

## 2018-07-21 ENCOUNTER — Other Ambulatory Visit: Payer: Self-pay

## 2018-07-21 ENCOUNTER — Ambulatory Visit
Admission: RE | Admit: 2018-07-21 | Discharge: 2018-07-21 | Disposition: A | Discharge status: Home / Self Care | Payer: Medicare Other | Source: Ambulatory Visit | Attending: Gastroenterology | Admitting: Gastroenterology

## 2018-07-21 DIAGNOSIS — R7989 Other specified abnormal findings of blood chemistry: Secondary | ICD-10-CM

## 2018-07-21 DIAGNOSIS — R740 Nonspecific elevation of levels of transaminase and lactic acid dehydrogenase [LDH]: Secondary | ICD-10-CM | POA: Diagnosis present

## 2018-07-21 DIAGNOSIS — R945 Abnormal results of liver function studies: Secondary | ICD-10-CM | POA: Insufficient documentation

## 2018-07-21 DIAGNOSIS — R7401 Elevation of levels of liver transaminase levels: Secondary | ICD-10-CM

## 2020-01-28 ENCOUNTER — Other Ambulatory Visit: Payer: Self-pay | Admitting: Sports Medicine

## 2020-01-28 DIAGNOSIS — G8929 Other chronic pain: Secondary | ICD-10-CM

## 2020-01-28 DIAGNOSIS — M659 Synovitis and tenosynovitis, unspecified: Secondary | ICD-10-CM

## 2020-01-28 DIAGNOSIS — M25871 Other specified joint disorders, right ankle and foot: Secondary | ICD-10-CM

## 2020-02-17 ENCOUNTER — Ambulatory Visit
Admission: RE | Admit: 2020-02-17 | Discharge: 2020-02-17 | Disposition: A | Discharge status: Home / Self Care | Payer: Medicare HMO | Source: Ambulatory Visit | Attending: Sports Medicine | Admitting: Sports Medicine

## 2020-02-17 ENCOUNTER — Other Ambulatory Visit: Payer: Self-pay

## 2020-02-17 DIAGNOSIS — G8929 Other chronic pain: Secondary | ICD-10-CM

## 2020-02-17 DIAGNOSIS — M659 Synovitis and tenosynovitis, unspecified: Secondary | ICD-10-CM | POA: Diagnosis present

## 2020-02-17 DIAGNOSIS — M25571 Pain in right ankle and joints of right foot: Secondary | ICD-10-CM | POA: Diagnosis present

## 2020-02-17 DIAGNOSIS — M25871 Other specified joint disorders, right ankle and foot: Secondary | ICD-10-CM | POA: Diagnosis present

## 2022-10-09 ENCOUNTER — Other Ambulatory Visit: Payer: Self-pay | Admitting: Student

## 2022-10-09 DIAGNOSIS — R519 Headache, unspecified: Secondary | ICD-10-CM

## 2022-10-09 DIAGNOSIS — R569 Unspecified convulsions: Secondary | ICD-10-CM

## 2022-10-23 ENCOUNTER — Ambulatory Visit
Admission: RE | Admit: 2022-10-23 | Discharge: 2022-10-23 | Disposition: A | Discharge status: Home / Self Care | Payer: Medicare HMO | Source: Ambulatory Visit | Attending: Student | Admitting: Student

## 2022-10-23 DIAGNOSIS — R569 Unspecified convulsions: Secondary | ICD-10-CM

## 2022-10-23 DIAGNOSIS — R519 Headache, unspecified: Secondary | ICD-10-CM

## 2023-01-01 ENCOUNTER — Ambulatory Visit: Payer: Medicare HMO | Attending: Family Medicine

## 2023-01-01 DIAGNOSIS — R2681 Unsteadiness on feet: Secondary | ICD-10-CM | POA: Diagnosis not present

## 2023-01-01 DIAGNOSIS — R293 Abnormal posture: Secondary | ICD-10-CM | POA: Diagnosis present

## 2023-01-01 DIAGNOSIS — R262 Difficulty in walking, not elsewhere classified: Secondary | ICD-10-CM | POA: Insufficient documentation
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
## 2023-01-07 ENCOUNTER — Ambulatory Visit: Payer: Medicare HMO

## 2023-01-07 ENCOUNTER — Ambulatory Visit: Payer: Medicare HMO | Attending: Family Medicine

## 2023-01-07 DIAGNOSIS — R2681 Unsteadiness on feet: Secondary | ICD-10-CM | POA: Insufficient documentation

## 2023-01-07 DIAGNOSIS — R293 Abnormal posture: Secondary | ICD-10-CM

## 2023-01-07 DIAGNOSIS — R262 Difficulty in walking, not elsewhere classified: Secondary | ICD-10-CM | POA: Insufficient documentation
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
## 2023-01-09 ENCOUNTER — Ambulatory Visit: Payer: Medicare HMO

## 2023-01-09 ENCOUNTER — Ambulatory Visit: Payer: Medicare HMO | Admitting: Physical Therapy

## 2023-01-14 ENCOUNTER — Ambulatory Visit: Payer: Medicare HMO

## 2023-01-16 ENCOUNTER — Ambulatory Visit: Payer: Medicare HMO

## 2023-01-16 ENCOUNTER — Telehealth: Payer: Self-pay

## 2023-01-16 NOTE — Telephone Encounter (Signed)
Pt did not arrive for scheduled appointment. No telephone call nor message preceeded this absence. Author attempted to contact pt via telephone number listed in chart. Pt also a no-show for previous appointment 9/10, however no phone call made regarding that miss.   9:11 AM, 01/16/23 Rosamaria Lints, PT, DPT Physical Therapist - [facility]  Outpatient Physical Therapy- Main Campus [phone]

## 2023-01-21 ENCOUNTER — Ambulatory Visit: Payer: Medicare HMO

## 2023-01-28 ENCOUNTER — Ambulatory Visit: Payer: Medicare HMO

## 2023-01-30 ENCOUNTER — Telehealth: Payer: Self-pay | Admitting: Physical Therapy

## 2023-01-30 ENCOUNTER — Ambulatory Visit: Payer: Medicare HMO | Admitting: Physical Therapy

## 2023-01-30 NOTE — Telephone Encounter (Signed)
PT reached out to Pt via phone number provided. Left voicemail to notify pt of missed PT appointment, time and date of next appointment on 02/04/23 at 11:00AM, as well as reminder of cancellation policy if pt is unable to attend PT treatments.   Grier Rocher PT, DPT  Physical Therapist - [place]  [facility]  11:13 AM 01/30/23

## 2023-02-04 ENCOUNTER — Ambulatory Visit: Payer: Medicare HMO

## 2023-02-06 ENCOUNTER — Ambulatory Visit: Payer: Medicare HMO

## 2023-02-13 ENCOUNTER — Ambulatory Visit: Payer: Medicare HMO

## 2023-02-18 ENCOUNTER — Ambulatory Visit: Payer: Medicare HMO

## 2023-02-20 ENCOUNTER — Ambulatory Visit: Payer: Medicare HMO

## 2023-02-25 ENCOUNTER — Ambulatory Visit: Payer: Medicare HMO

## 2023-02-27 ENCOUNTER — Ambulatory Visit: Payer: Medicare HMO

## 2023-03-06 ENCOUNTER — Ambulatory Visit: Payer: Medicare HMO | Admitting: Physical Therapy

## 2023-03-12 ENCOUNTER — Ambulatory Visit: Payer: Medicare HMO | Admitting: Physical Therapy

## 2023-03-17 ENCOUNTER — Ambulatory Visit: Payer: Medicare HMO | Admitting: Physical Therapy

## 2023-03-25 ENCOUNTER — Ambulatory Visit: Payer: Medicare HMO

## 2023-03-28 ENCOUNTER — Ambulatory Visit: Payer: Medicare HMO

## 2023-03-31 ENCOUNTER — Ambulatory Visit: Payer: Medicare HMO | Admitting: Physical Therapy

## 2023-04-07 ENCOUNTER — Ambulatory Visit: Payer: Medicare HMO | Admitting: Physical Therapy

## 2023-04-09 ENCOUNTER — Ambulatory Visit: Payer: Medicare HMO

## 2023-04-14 ENCOUNTER — Ambulatory Visit: Payer: Medicare HMO

## 2023-04-16 ENCOUNTER — Ambulatory Visit: Payer: Medicare HMO

## 2023-11-10 NOTE — Progress Notes (Signed)
 Ref Provider: Valora Lynwood FALCON, MD  PCP: Valora Lynwood FALCON, MD  Assessment and Plan:   In most patients we give written parts of assessment and plan to patient under Patient Instructions/After Visit Summary. So some parts are directed to patient.  Dear Mr. Bradley Butler, It was our pleasure to participate in your care. We have typed up brief summary of what we discussed. Assessment & Plan Obstructive Sleep Apnea Mild non-positional obstructive sleep apnea with an AHI of 8 and an RDI of 17. Noncompliance with CPAP therapy due to concerns about potential damage by his dog. - Pull compliance report for CPAP usage - Discuss CPAP compliance with pulmonologist Dr. Theotis on November 13, 2023 - Discuss strategies to acclimate the dog to the CPAP equipment and emphasize the importance of CPAP usage for effective management of sleep apnea - Encouraged use of CPAP   Family History of Dementia (?Alzheimer's in Biological Mother, diagnosed age 87 - 25) in patient with subjective cognitive concerns, history of seizures, cerebral palsy, developmental delay Family history of dementia, including biological mother with possible early-onset Alzheimer's and adopted father with dementia. - Provide information on Alzheimer's genetic testing and prevention - Discuss genetic testing options with family - Consider ordering beta amyloid panel and APOE genetic test if he decides to proceed. Discussed we would not typically order this testing at this juncture and would not recommend; however, patient is very concerned about his family history. After discussing with his family, he has elected to proceed with memory testing. We discussed risks and benefits of this and how results might not change treatment at this time.  - Emphasize lifestyle modifications to reduce risk, such as adequate sleep, nutrition, and mental stimulation - Discuss decision-making process regarding testing and the importance of considering personal  preferences and family discussions - Discussed Alzheimer's Prevention strategies and programs.  Info on AD and cognitive impairment provided  - Labs: Vitamin B12, Vitamin D, Vimpat Level, Phosphorylated Tau 217 (p-Tau 217), plasma ([facility] number [id number]), Beta Amyloid 42/40 ratio, Plasma (OjaR1283, [facility] number [id number]), APO Alzheimer's Risk (OjaR1884, [facility] number [id number]) - SLUMS 11/10/2023:  - Printed UpToDate Alzheimer's/Dementia Fact Sheets   Elevated Liver Enzymes Chronic elevation of liver enzymes (AST 45, ALT 97) since 2023. Less than 1% chance of abnormal hepatic function tests with current medications (lacosamide and Lyrica). - Refer to GI specialist for further evaluation of liver function  Vitamin B12 Deficiency Vitamin B12 deficiency, which can have cognitive effects. - Order vitamin B12 level - Order vitamin D level  Seizure and myoclonic jerks: No breakthrough seizures, myoclonic jerks are well controlled - Refill/Continue Vimpat 150 mg by mouth every morning and 200 mg by mouth every night  - Refill/Continue taking Lyrica 300 mg at night   Headache - Patient states that he has about 3-4 headache days a month and takes tylenol  for headache rescue - Improved. Continue to monitor   Dizziness, nonspecific  - Recommend keeping a journal of triggers, symptoms, duration, etc. - Let us  know if symptoms worsen or fail to improve  - Vimpat level as above   Return in about 3 months (around 02/10/2024).   Interim History date 11/10/2023   Bradley Butler is a 38 y.o. male here for treatment and evaluation of Seizures and Headache, unaccompanied.  [name] last visit was on 03/12/2023  History of Present Illness Bradley Butler is a 38 year old male who presents with concerns about CPAP compliance and family history of dementia.  He  has a history of mild non-positional obstructive sleep apnea, diagnosed via a home sleep test on February 28, 2024, which showed an AHI of 8 and an RDI of  17. He has been noncompliant with CPAP therapy due to concerns about potential damage to the machine by his dog during sleep. He has not worn the CPAP for an extended period and is unsure of the exact duration.  He has chronic elevation of liver enzymes, with a CMP on Sep 15, 2023, showing an AST of 45 and ALT of 97. This elevation has been present since 2023.  He is concerned about his family history of dementia. His birth mother, aged 28-65, has been diagnosed with dementia or Alzheimer's. He mentions occasional forgetfulness but does not believe it is significant. He is also aware of his adoptive father's dementia, who is in his eighties.  He experiences work-related stress and is considering a job change. He lives with a dog and has a roommate with whom he socializes. He acknowledges that his nutrition is not optimal, stating 'that ain't happening' in response to dietary recommendations. Patient states he has been having intermittent dizziness.  He is unable to say when they symptoms initially started.  Episodes occur a little after taking his vimpat.  Not every time.  Episodes occur when he is in a hurry or when he is under stress.  No associated symptoms.      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 (intra cerebral hemorrhage) spastic gait s/p LE surgery with somewhat improved gait, left mid leg down numbness.  Seizure and myoclonic jerks: No breakthrough seizures, myoclonic jerks are well controlled: Patient had seizure onset since very early childhood (status epilepticus as infant). In childhood, he was taking Tegretol. He has been seizure free for a long time and he was taken off seizure medications. Around 2009, he started having cold chills and will feel tired all over and then he had a big grand mal seizure when he almost went into statis  epilepticus and was transferred to [facility] and he was restarted on seizure medications. He was started on Keppra where he was doing well but then he developed rash on it. Then he was started on Topamax and he started having zoning out spells and memory problem and repeating himself. Patient is still driving.   Patient reports myoclonic jerks without bowel and bladder incontinence. Patient remains aware during episodes and has no loss of memory. Per patient he experiences jerks and some dizziness. States he feels completely normal after, no post ictal, and denies aura.   Patient reports occasional side effect of sleepiness and dizziness with his morning medication (Vimpat 150 mg). Per patient he feels very off balance and has to lay down. The symptoms last for about 1 hour after taking the medication. He has tried to increase the food to see if this helps, however, some days he does not eat and does not get side effects. Patient reports cold chill/shivers and sharp pain starting in his head and progressing to his entire body when he forgets to take his medications. This also occurs when he takes his medication. Unable to remember if this has ever been witnessed. Patient reports shivering episodes which occur anytime throughout the day with most recent episode on 5/19/202. Episodes last one or two seconds, do not cause Loss Of Consciousness or bowel  and bladder incontinence.   MRI Brain 10/04/2016: no acute intracranial abnormality or significant interval change, stable posterior chronic white matter loss and thinning of the corpus callosum, and no acute or focal abnormality to explain seizures.  EEG 10/04/2016: minimally abnormal awake and sleep EEG due to generalized low amplitude theta slowing of the background. EEG 11/2012: abnormal left frontal epileptiform discharges, bi frontal slow  Medication: Lyrica 300 mg at night, Vimpat 200 mg two times a day (every 12 hours) 03/23/2018, Keppra (rash),  Topiramate (zoning out, memory problem, repeating self)   Headache - concerning for migraine + occipital neuralgia (right occipital nerve tenderness): Patient states he has increased headaches when he feels stressed at home. States he has had blurry vision since starting his anti-seizure medications. Occurs with headache. States he has sharp head pains in the last couple of weeks.  Spoke with mom - using flonase for headache and inhaler, weather. She has no concerns.   MRI Brain without contrast 10/23/2022 IMPRESSION: No evidence of an acute intracranial abnormality. Redemonstrated deep cerebral white matter volume loss with prominence of the bodies and atria of the lateral ventricles. Associated thinning of the posterior body and splenium of the corpus callosum. These findings are consistent with the patient's history of cerebral palsy.   Medications:  Preventative: Magnesium, Pregabalin, Lacosamide  Rescue: Ibuprofen, Tylenol   Slightly elevated liver enzymes: AST - 41, ALT - 80 on 06/28/2021. Patient has +ve family history of elevated liver enzymes (cousin).    Cognitive issues- multifactorial in a patient with history of seizures, cerebral palsy, developmental delay   Hypovitaminosis: Low Vitamin B12 (258) and Vitamin D (19.6) on 06/28/2021  Insomnia in patient with a history of snoring   Previous history of itchy ears  Physical Exam   Vitals Vitals:   11/10/23 1045  BP: 110/70  Pulse: 90  SpO2: 94%  Weight: 72.3 kg (159 lb 6.4 oz)  Height: 160 cm (5' 3)      Body mass index is 28.24 kg/m.  (Some of the exam changes noted are from previous clinical observations)  General Exam He is a young Caucasian gentleman here by himself. He is short statue. He has bowlegged legs. He has cerebral palsy related facial changes.  Clubbing of fingers No hepatomegaly   Neurological Exam Romberg negative Short shuffling gait spastic - walks cautiously  Bilateral arm swing present     Medications: Current Outpatient Medications on File Prior to Visit  Medication Sig Dispense Refill  . albuterol (PROVENTIL) 2.5 mg /3 mL (0.083 %) nebulizer solution Take 3 mLs (2.5 mg total) by nebulization every 6 (six) hours as needed for Wheezing 75 mL 12  . albuterol MDI, PROVENTIL, VENTOLIN, PROAIR, HFA 90 mcg/actuation inhaler Inhale 2 inhalations into the lungs every 6 (six) hours as needed for Wheezing 1 each 4  . BREO ELLIPTA 100-25 mcg/dose DsDv inhaler INHALE 1 PUFF INTO THE LUNGS ONCE DAILY.RINSE MOUTH AFTER USE 60 each 6  . celecoxib (CELEBREX) 200 MG capsule TAKE 1 CAPSULE BY MOUTH TWICE A DAY 60 capsule 5  . fluticasone (FLONASE) 50 mcg/actuation nasal spray Place 2 sprays into both nostrils once daily. 16 g 1  . lacosamide (VIMPAT) 150 mg tablet TAKE 1 TABLET BY MOUTH DAILY WITH BREAKFAST 90 tablet 1  . lacosamide (VIMPAT) 200 mg tablet TAKE 1 TABLET BY MOUTH EACH NIGHT AT BEDTIME 90 tablet 1  . pregabalin (LYRICA) 300 MG capsule TAKE 1 CAPSULE BY MOUTH EVERY DAY 90 capsule 1   No  current facility-administered medications on file prior to visit.   Past Medical History:  Past Medical History:  Diagnosis Date  . Allergic rhinitis due to allergen   . Arthritis foot  . Asthma (HHS-HCC) 10/06/2013  . Bronchopulmonary dysplasia (CMS/HHS-HCC)   . Cerebral hemorrhage (CMS/HHS-HCC) infant  . Cerebral palsy (CMS/HHS-HCC) 10/06/2013  . Development delay birth  . History of seizures as a child   . Hydrocephalus (CMS/HHS-HCC) infant  . Premature baby (HHS-HCC)   . Seizures (CMS/HHS-HCC)   . Sinusitis, unspecified     Past Surgical History:  Past Surgical History:  Procedure Laterality Date  . EPIGASTRIC HERNIA REPAIR  at birth   hernia repair and fundoplication  . heel cord lenthening Bilateral    around age 89-6yo  . stoma closure  age 26   Family History:  Family History  Adopted: Yes  Problem Relation Name Age of Onset  . Dementia Mother birth mom        early  67s  . Alcohol  abuse Father     Social History:  Social History   Socioeconomic History  . Marital status: Single  Tobacco Use  . Smoking status: Never    Passive exposure: Never  . Smokeless tobacco: Never  Vaping Use  . Vaping status: Never Used  Substance and Sexual Activity  . Alcohol  use: No  . Drug use: No  . Sexual activity: Not Currently    Partners: Female    Birth control/protection: None   Social Drivers of Health   Financial Resource Strain: Low Risk  (09/21/2023)   Overall Financial Resource Strain (CARDIA)   . Difficulty of Paying Living Expenses: Not very hard  Food Insecurity: Food Insecurity Present (09/21/2023)   Hunger Vital Sign   . Worried About Programme researcher, broadcasting/film/video in the Last Year: Sometimes true   . Ran Out of Food in the Last Year: Never true  Transportation Needs: No Transportation Needs (09/21/2023)   PRAPARE - Transportation   . Lack of Transportation (Medical): No   . Lack of Transportation (Non-Medical): No  Housing Stability: Low Risk  (09/21/2023)   Housing Stability Vital Sign   . Unable to Pay for Housing in the Last Year: No   . Number of Times Moved in the Last Year: 0   . Homeless in the Last Year: No   Allergies:  Allergies  Allergen Reactions  . Hydrocodone Other (See Comments)    Concerned due to side effects  . Keppra [Levetiracetam] Unknown  . Levaquin [Levofloxacin] Unknown  . Mold Unknown  . Tessalon [Benzonatate] Other (See Comments)    Causes over drowsiness   This note has been created using automated tools and reviewed for accuracy by KAITLIN CAFFARO. Parts of this note were created with a combination of Dragon dictation and automated scribing services. Please note any typographical errors are unintentional. I apologize for any typographical errors that were not detected and corrected.   Return in about 3 months (around 02/10/2024). The patient will contact us  earlier if severity of neurologic symptoms increases or  changes. This may or may not, depending on description of symptoms, necessitate an earlier appointment.    Payor: [employer] MEDICARE ADVANTAGE / Plan: [employer]ET[employer] MEDICARE ADV HMO / Product Type: HMO /    Attestation Statement:   I personally performed the service, non-incident to. (WP)   KAITLIN CAFFARO, PA   Kaitlin Caffaro, PA-C Board Certified by [facility] - Neurology  A [facility]

## 2023-11-13 NOTE — Progress Notes (Signed)
 Follow-up   History of Present Illness: Bradley Butler is a 38 y.o. male who presents to clinic for recheck. Asthma stable. No acute sxs. On breo daily. Hardly need the albuterol Osa, memory is better when he wears his cpap per mom. However, has not been wearing it. Advised to do so. Has room to loose weight.   Current Medications:  Current Outpatient Medications  Medication Sig Dispense Refill  . albuterol (PROVENTIL) 2.5 mg /3 mL (0.083 %) nebulizer solution Take 3 mLs (2.5 mg total) by nebulization every 6 (six) hours as needed for Wheezing 75 mL 12  . albuterol MDI, PROVENTIL, VENTOLIN, PROAIR, HFA 90 mcg/actuation inhaler Inhale 2 inhalations into the lungs every 6 (six) hours as needed for Wheezing 1 each 4  . BREO ELLIPTA 100-25 mcg/dose DsDv inhaler INHALE 1 PUFF INTO THE LUNGS ONCE DAILY.RINSE MOUTH AFTER USE 60 each 6  . celecoxib (CELEBREX) 200 MG capsule TAKE 1 CAPSULE BY MOUTH TWICE A DAY 60 capsule 5  . fluticasone (FLONASE) 50 mcg/actuation nasal spray Place 2 sprays into both nostrils once daily. 16 g 1  . lacosamide (VIMPAT) 150 mg tablet TAKE 1 TABLET BY MOUTH DAILY WITH BREAKFAST 90 tablet 1  . lacosamide (VIMPAT) 200 mg tablet TAKE 1 TABLET BY MOUTH EACH NIGHT AT BEDTIME 90 tablet 1  . pregabalin (LYRICA) 300 MG capsule TAKE 1 CAPSULE BY MOUTH EVERY DAY 90 capsule 1   No current facility-administered medications for this visit.    Problem List:  Patient Active Problem List  Diagnosis  . Asthma (HHS-HCC)  . Seizure (CMS/HHS-HCC)  . Cerebral palsy (CMS/HHS-HCC)  . Bronchopulmonary dysplasia (CMS/HHS-HCC)  . Premature baby (HHS-HCC)  . Pain in joint of right ankle  . Primary localized osteoarthrosis of ankle and foot  . Spastic cerebral palsy (CMS/HHS-HCC)    History: Past Medical History:  Diagnosis Date  . Allergic rhinitis due to allergen   . Arthritis foot  . Asthma (HHS-HCC) 10/06/2013  . Bronchopulmonary dysplasia (CMS/HHS-HCC)   . Cerebral  hemorrhage (CMS/HHS-HCC) infant  . Cerebral palsy (CMS/HHS-HCC) 10/06/2013  . Development delay birth  . History of seizures as a child   . Hydrocephalus (CMS/HHS-HCC) infant  . Premature baby (HHS-HCC)   . Seizures (CMS/HHS-HCC)   . Sinusitis, unspecified     Past Surgical History:  Procedure Laterality Date  . EPIGASTRIC HERNIA REPAIR  at birth   hernia repair and fundoplication  . heel cord lenthening Bilateral    around age 64-6yo  . stoma closure  age 48    Family History  Adopted: Yes  Problem Relation Name Age of Onset  . Dementia Mother birth mom        early 1s  . Alcohol  abuse Father      Social History   Socioeconomic History  . Marital status: Single  Tobacco Use  . Smoking status: Never    Passive exposure: Never  . Smokeless tobacco: Never  Vaping Use  . Vaping status: Never Used  Substance and Sexual Activity  . Alcohol  use: No  . Drug use: No  . Sexual activity: Not Currently    Partners: Female    Birth control/protection: None   Social Drivers of Health   Financial Resource Strain: Low Risk  (09/21/2023)   Overall Financial Resource Strain (CARDIA)   . Difficulty of Paying Living Expenses: Not very hard  Food Insecurity: Food Insecurity Present (09/21/2023)   Hunger Vital Sign   . Worried About Programme researcher, broadcasting/film/video  in the Last Year: Sometimes true   . Ran Out of Food in the Last Year: Never true  Transportation Needs: No Transportation Needs (09/21/2023)   PRAPARE - Transportation   . Lack of Transportation (Medical): No   . Lack of Transportation (Non-Medical): No    Allergies:  Hydrocodone, Keppra [levetiracetam], Levaquin [levofloxacin], Mold, and Tessalon [benzonatate]  Review of Systems: As per above. Pretty much unchanged with the exception that his breathing is improved. No associated cardiopulmonary, GI, GU, dermatological symptoms today. No focal neurological symptoms or psychological changes.   Physical Exam: BP 126/84 (BP  Location: Right upper arm, Patient Position: Sitting, BP Cuff Size: Adult)   Pulse 76   Ht 160 cm (5' 3)   Wt 71.8 kg (158 lb 3.2 oz)   SpO2 94% Comment: room air  BMI 28.02 kg/m  71.8 kg (158 lb 3.2 oz) 94% General:  NAD. Able to speak in complete sentences without cough or dyspnea HEENT: Normocephalic, nontraumatic. Extraocular movements intact NECK: Supple. No JVD, nodes, thryomegaly CV: RRR no murmurs, gallops, rubs PULM: Normal respiratory effort, Clear to auscultation bilaterally without wheezing or crackles EXTREMITIES: No significant edema, cyanosis or Homans'signs SKIN: Fair turgor. No rashes LYMPHATIC: No nodes NEURO: No gross deficits PSYCH: Appropriate affect, alert, oriented    Impression:  Mild intermittent asthma, less dyspnea and coughing since last seen.  Occasional reflux. Minimum rhinitis.  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

## 2023-11-14 ENCOUNTER — Inpatient Hospital Stay
Admission: EM | Admit: 2023-11-14 | Discharge: 2023-12-05 | DRG: 853 | Disposition: E | Attending: Surgery | Admitting: Surgery

## 2023-11-14 ENCOUNTER — Other Ambulatory Visit: Payer: Self-pay

## 2023-11-14 ENCOUNTER — Emergency Department: Admitting: Anesthesiology

## 2023-11-14 ENCOUNTER — Encounter: Admission: EM | Disposition: E | Payer: Self-pay | Source: Home / Self Care | Attending: Surgery

## 2023-11-14 ENCOUNTER — Emergency Department

## 2023-11-14 DIAGNOSIS — J9601 Acute respiratory failure with hypoxia: Secondary | ICD-10-CM

## 2023-11-14 DIAGNOSIS — J45909 Unspecified asthma, uncomplicated: Secondary | ICD-10-CM | POA: Diagnosis present

## 2023-11-14 DIAGNOSIS — G9341 Metabolic encephalopathy: Secondary | ICD-10-CM | POA: Diagnosis present

## 2023-11-14 DIAGNOSIS — Z79899 Other long term (current) drug therapy: Secondary | ICD-10-CM

## 2023-11-14 DIAGNOSIS — R6521 Severe sepsis with septic shock: Secondary | ICD-10-CM | POA: Diagnosis present

## 2023-11-14 DIAGNOSIS — G40909 Epilepsy, unspecified, not intractable, without status epilepticus: Secondary | ICD-10-CM | POA: Diagnosis present

## 2023-11-14 DIAGNOSIS — R112 Nausea with vomiting, unspecified: Secondary | ICD-10-CM | POA: Diagnosis present

## 2023-11-14 DIAGNOSIS — K55029 Acute infarction of small intestine, extent unspecified: Secondary | ICD-10-CM | POA: Diagnosis present

## 2023-11-14 DIAGNOSIS — Z515 Encounter for palliative care: Secondary | ICD-10-CM

## 2023-11-14 DIAGNOSIS — N179 Acute kidney failure, unspecified: Secondary | ICD-10-CM | POA: Diagnosis present

## 2023-11-14 DIAGNOSIS — A419 Sepsis, unspecified organism: Secondary | ICD-10-CM | POA: Diagnosis present

## 2023-11-14 DIAGNOSIS — K631 Perforation of intestine (nontraumatic): Secondary | ICD-10-CM | POA: Diagnosis present

## 2023-11-14 DIAGNOSIS — K56609 Unspecified intestinal obstruction, unspecified as to partial versus complete obstruction: Secondary | ICD-10-CM | POA: Diagnosis present

## 2023-11-14 DIAGNOSIS — E872 Acidosis, unspecified: Secondary | ICD-10-CM | POA: Diagnosis present

## 2023-11-14 DIAGNOSIS — J95821 Acute postprocedural respiratory failure: Secondary | ICD-10-CM | POA: Diagnosis present

## 2023-11-14 DIAGNOSIS — Z66 Do not resuscitate: Secondary | ICD-10-CM | POA: Diagnosis present

## 2023-11-14 DIAGNOSIS — G809 Cerebral palsy, unspecified: Secondary | ICD-10-CM | POA: Diagnosis present

## 2023-11-14 DIAGNOSIS — M79A3 Nontraumatic compartment syndrome of abdomen: Secondary | ICD-10-CM | POA: Diagnosis present

## 2023-11-14 DIAGNOSIS — J9602 Acute respiratory failure with hypercapnia: Secondary | ICD-10-CM

## 2023-11-14 DIAGNOSIS — K55059 Acute (reversible) ischemia of intestine, part and extent unspecified: Secondary | ICD-10-CM | POA: Diagnosis present

## 2023-11-14 DIAGNOSIS — K559 Vascular disorder of intestine, unspecified: Secondary | ICD-10-CM | POA: Diagnosis present

## 2023-11-14 HISTORY — PX: LAPAROTOMY: SHX154

## 2023-11-14 HISTORY — PX: BOWEL RESECTION: SHX1257

## 2023-11-14 LAB — BLOOD GAS, ARTERIAL
Acid-base deficit: 17.6 mmol/L — ABNORMAL HIGH (ref 0.0–2.0)
Acid-base deficit: 16.4 mmol/L — ABNORMAL HIGH (ref 0.0–2.0)
Bicarbonate: 12.2 mmol/L — ABNORMAL LOW (ref 20.0–28.0)
Bicarbonate: 13 mmol/L — ABNORMAL LOW (ref 20.0–28.0)
FIO2: 40 %
MECHVT: 500 mL
Mechanical Rate: 18
O2 Saturation: 100 %
O2 Saturation: 97.9 %
PEEP: 5 cmH2O
Patient temperature: 37
Patient temperature: 37
pCO2 arterial: 42 mmHg (ref 32–48)
pCO2 arterial: 43 mmHg (ref 32–48)
pH, Arterial: 7.07 — CL (ref 7.35–7.45)
pH, Arterial: 7.09 — CL (ref 7.35–7.45)
pO2, Arterial: 170 mmHg — ABNORMAL HIGH (ref 83–108)
pO2, Arterial: 95 mmHg (ref 83–108)

## 2023-11-14 LAB — URINALYSIS, ROUTINE W REFLEX MICROSCOPIC
Bacteria, UA: NONE SEEN
Bilirubin Urine: NEGATIVE
Glucose, UA: >=500 mg/dL — AB
Ketones, ur: NEGATIVE mg/dL
Leukocytes,Ua: NEGATIVE
Nitrite: NEGATIVE
Protein, ur: 100 mg/dL — AB
Specific Gravity, Urine: >1.046 — ABNORMAL HIGH (ref 1.005–1.030)
Squamous Epithelial / HPF: 0 /HPF (ref 0–5)
pH: 5 (ref 5.0–8.0)

## 2023-11-14 LAB — COMPREHENSIVE METABOLIC PANEL WITH GFR
ALT: 83 U/L — ABNORMAL HIGH (ref 0–44)
ALT: 49 U/L — ABNORMAL HIGH (ref 0–44)
AST: 49 U/L — ABNORMAL HIGH (ref 15–41)
AST: 62 U/L — ABNORMAL HIGH (ref 15–41)
Albumin: 5.2 g/dL — ABNORMAL HIGH (ref 3.5–5.0)
Albumin: 2.8 g/dL — ABNORMAL LOW (ref 3.5–5.0)
Alkaline Phosphatase: 93 U/L (ref 38–126)
Alkaline Phosphatase: 53 U/L (ref 38–126)
Anion gap: 18 — ABNORMAL HIGH (ref 5–15)
Anion gap: 8 (ref 5–15)
BUN: 29 mg/dL — ABNORMAL HIGH (ref 6–20)
BUN: 29 mg/dL — ABNORMAL HIGH (ref 6–20)
CO2: 21 mmol/L — ABNORMAL LOW (ref 22–32)
CO2: 16 mmol/L — ABNORMAL LOW (ref 22–32)
Calcium: 10.1 mg/dL (ref 8.9–10.3)
Calcium: 7.3 mg/dL — ABNORMAL LOW (ref 8.9–10.3)
Chloride: 102 mmol/L (ref 98–111)
Chloride: 116 mmol/L — ABNORMAL HIGH (ref 98–111)
Creatinine, Ser: 1.47 mg/dL — ABNORMAL HIGH (ref 0.61–1.24)
Creatinine, Ser: 1.74 mg/dL — ABNORMAL HIGH (ref 0.61–1.24)
GFR, Estimated: >60 mL/min (ref >60)
GFR, Estimated: 51 mL/min — ABNORMAL LOW (ref >60)
Glucose, Bld: 209 mg/dL — ABNORMAL HIGH (ref 70–99)
Glucose, Bld: 133 mg/dL — ABNORMAL HIGH (ref 70–99)
Potassium: 3.8 mmol/L (ref 3.5–5.1)
Potassium: 4.3 mmol/L (ref 3.5–5.1)
Sodium: 141 mmol/L (ref 135–145)
Sodium: 140 mmol/L (ref 135–145)
Total Bilirubin: 1.1 mg/dL (ref 0.0–1.2)
Total Bilirubin: 2.8 mg/dL — ABNORMAL HIGH (ref 0.0–1.2)
Total Protein: 8.7 g/dL — ABNORMAL HIGH (ref 6.5–8.1)
Total Protein: 4.7 g/dL — ABNORMAL LOW (ref 6.5–8.1)

## 2023-11-14 LAB — CBC
HCT: 60.4 % — ABNORMAL HIGH (ref 39.0–52.0)
Hemoglobin: 20.1 g/dL — ABNORMAL HIGH (ref 13.0–17.0)
MCH: 29.8 pg (ref 26.0–34.0)
MCHC: 33.3 g/dL (ref 30.0–36.0)
MCV: 89.5 fL (ref 80.0–100.0)
Platelets: 256 K/uL (ref 150–400)
RBC: 6.75 MIL/uL — ABNORMAL HIGH (ref 4.22–5.81)
RDW: 12.8 % (ref 11.5–15.5)
WBC: 11 K/uL — ABNORMAL HIGH (ref 4.0–10.5)
nRBC: 0 % (ref 0.0–0.2)

## 2023-11-14 LAB — LACTIC ACID, PLASMA
Lactic Acid, Venous: 4.5 mmol/L (ref 0.5–1.9)
Lactic Acid, Venous: 4.7 mmol/L (ref 0.5–1.9)
Lactic Acid, Venous: 4.5 mmol/L (ref 0.5–1.9)
Lactic Acid, Venous: 4.5 mmol/L (ref 0.5–1.9)

## 2023-11-14 LAB — GLUCOSE, CAPILLARY: Glucose-Capillary: 268 mg/dL — ABNORMAL HIGH (ref 70–99)

## 2023-11-14 LAB — LIPASE, BLOOD: Lipase: 65 U/L — ABNORMAL HIGH (ref 11–51)

## 2023-11-14 LAB — BETA-HYDROXYBUTYRIC ACID: Beta-Hydroxybutyric Acid: 0.18 mmol/L (ref 0.05–0.27)

## 2023-11-14 LAB — MRSA NEXT GEN BY PCR, NASAL: MRSA by PCR Next Gen: NOT DETECTED

## 2023-11-14 SURGERY — LAPAROTOMY, EXPLORATORY
Anesthesia: General | Site: Abdomen

## 2023-11-14 MED ORDER — FENTANYL CITRATE PF 50 MCG/ML IJ SOSY: 25.0000 ug | PREFILLED_SYRINGE | Freq: Once | INTRAMUSCULAR | Status: DC

## 2023-11-14 MED ORDER — VASOPRESSIN 20 UNIT/ML IV SOLN
INTRAVENOUS | Status: DC | PRN
  Administered 2023-11-14 (×3): 1 [IU] via INTRAVENOUS

## 2023-11-14 MED ORDER — 0.9 % SODIUM CHLORIDE (POUR BTL) OPTIME
TOPICAL | Status: DC | PRN
  Administered 2023-11-14: 4000 mL

## 2023-11-14 MED ORDER — HYDROMORPHONE HCL 1 MG/ML IJ SOLN
0.5000 mg | Freq: Once | INTRAMUSCULAR | Status: AC
  Administered 2023-11-14: 0.5 mg via INTRAVENOUS
  Filled 2023-11-14: qty 0.5

## 2023-11-14 MED ORDER — STERILE WATER FOR IRRIGATION IR SOLN
Status: DC | PRN
  Administered 2023-11-14: 1000 mL

## 2023-11-14 MED ORDER — LIDOCAINE HCL (PF) 2 % IJ SOLN
INTRAMUSCULAR | Status: AC
Start: 2023-11-14 — End: 2023-11-14
  Filled 2023-11-14: qty 5

## 2023-11-14 MED ORDER — PHENYLEPHRINE HCL-NACL 20-0.9 MG/250ML-% IV SOLN
INTRAVENOUS | Status: AC
  Filled 2023-11-14: qty 250

## 2023-11-14 MED ORDER — FENTANYL BOLUS VIA INFUSION
25.0000 ug | INTRAVENOUS | Status: DC | PRN
  Administered 2023-11-14 – 2023-11-15 (×2): 25 ug via INTRAVENOUS
  Administered 2023-11-15: 100 ug via INTRAVENOUS
  Administered 2023-11-15: 25 ug via INTRAVENOUS
  Administered 2023-11-15: 50 ug via INTRAVENOUS

## 2023-11-14 MED ORDER — CHLORHEXIDINE GLUCONATE CLOTH 2 % EX PADS: 6.0000 | MEDICATED_PAD | Freq: Every day | CUTANEOUS | Status: DC

## 2023-11-14 MED ORDER — HYDROMORPHONE HCL 1 MG/ML IJ SOLN
INTRAMUSCULAR | Status: AC
  Filled 2023-11-14: qty 1

## 2023-11-14 MED ORDER — PHENYLEPHRINE 80 MCG/ML (10ML) SYRINGE FOR IV PUSH (FOR BLOOD PRESSURE SUPPORT)
PREFILLED_SYRINGE | INTRAVENOUS | Status: DC | PRN
  Administered 2023-11-14 (×2): 80 ug via INTRAVENOUS

## 2023-11-14 MED ORDER — NOREPINEPHRINE 16 MG/250ML-% IV SOLN
0.0000 ug/min | INTRAVENOUS | Status: DC
  Administered 2023-11-14: 22 ug/min via INTRAVENOUS
  Administered 2023-11-15: 36 ug/min via INTRAVENOUS
  Administered 2023-11-15: 38 ug/min via INTRAVENOUS
  Filled 2023-11-14 (×3): qty 250

## 2023-11-14 MED ORDER — ORAL CARE MOUTH RINSE: 15.0000 mL | OROMUCOSAL | Status: DC | PRN

## 2023-11-14 MED ORDER — PIPERACILLIN-TAZOBACTAM 3.375 G IVPB 30 MIN
3.3750 g | Freq: Once | INTRAVENOUS | Status: AC
  Administered 2023-11-14: 3.375 g via INTRAVENOUS
  Filled 2023-11-14 (×2): qty 50

## 2023-11-14 MED ORDER — ONDANSETRON HCL 4 MG/2ML IJ SOLN
4.0000 mg | Freq: Once | INTRAMUSCULAR | Status: AC
  Administered 2023-11-14: 4 mg via INTRAVENOUS
  Filled 2023-11-14: qty 2

## 2023-11-14 MED ORDER — SODIUM CHLORIDE 0.9 % IV BOLUS
500.0000 mL | Freq: Once | INTRAVENOUS | Status: AC
  Administered 2023-11-14: 500 mL via INTRAVENOUS

## 2023-11-14 MED ORDER — NOREPINEPHRINE 4 MG/250ML-% IV SOLN
INTRAVENOUS | Status: AC
  Filled 2023-11-14: qty 250

## 2023-11-14 MED ORDER — PROPOFOL 10 MG/ML IV BOLUS
INTRAVENOUS | Status: AC
  Filled 2023-11-14: qty 20

## 2023-11-14 MED ORDER — ORAL CARE MOUTH RINSE: 15.0000 mL | OROMUCOSAL | Status: DC

## 2023-11-14 MED ORDER — ORAL CARE MOUTH RINSE
15.0000 mL | OROMUCOSAL | Status: DC
  Administered 2023-11-14 – 2023-11-15 (×12): 15 mL via OROMUCOSAL

## 2023-11-14 MED ORDER — PROPOFOL 1000 MG/100ML IV EMUL
INTRAVENOUS | Status: AC
  Filled 2023-11-14: qty 100

## 2023-11-14 MED ORDER — SODIUM CHLORIDE 0.9% FLUSH
10.0000 mL | Freq: Two times a day (BID) | INTRAVENOUS | Status: DC
  Administered 2023-11-14 – 2023-11-15 (×2): 10 mL

## 2023-11-14 MED ORDER — SUCCINYLCHOLINE CHLORIDE 200 MG/10ML IV SOSY
PREFILLED_SYRINGE | INTRAVENOUS | Status: DC | PRN
  Administered 2023-11-14: 120 mg via INTRAVENOUS

## 2023-11-14 MED ORDER — ALBUMIN HUMAN 5 % IV SOLN
INTRAVENOUS | Status: AC
  Filled 2023-11-14: qty 500

## 2023-11-14 MED ORDER — PHENYLEPHRINE HCL-NACL 20-0.9 MG/250ML-% IV SOLN
INTRAVENOUS | Status: DC | PRN
  Administered 2023-11-14: 20 ug/min via INTRAVENOUS

## 2023-11-14 MED ORDER — LIDOCAINE HCL (PF) 2 % IJ SOLN
INTRAMUSCULAR | Status: AC
  Filled 2023-11-14: qty 5

## 2023-11-14 MED ORDER — PIPERACILLIN-TAZOBACTAM 3.375 G IVPB
3.3750 g | Freq: Three times a day (TID) | INTRAVENOUS | Status: DC
  Administered 2023-11-14 – 2023-11-15 (×3): 3.375 g via INTRAVENOUS
  Filled 2023-11-14 (×3): qty 50

## 2023-11-14 MED ORDER — ONDANSETRON HCL 4 MG/2ML IJ SOLN
4.0000 mg | Freq: Once | INTRAMUSCULAR | Status: AC
  Administered 2023-11-14: 4 mg via INTRAVENOUS

## 2023-11-14 MED ORDER — LACTATED RINGERS IV SOLN: INTRAVENOUS | Status: AC

## 2023-11-14 MED ORDER — ETOMIDATE 2 MG/ML IV SOLN
INTRAVENOUS | Status: DC | PRN
  Administered 2023-11-14: 12 mg via INTRAVENOUS

## 2023-11-14 MED ORDER — BUPIVACAINE-MELOXICAM ER 400-12 MG/14ML IJ SOLN
INTRAMUSCULAR | Status: AC
Start: 2023-11-14 — End: 2023-11-14
  Filled 2023-11-14: qty 1

## 2023-11-14 MED ORDER — MIDAZOLAM HCL 2 MG/2ML IJ SOLN
4.0000 mg | INTRAMUSCULAR | Status: DC | PRN
  Administered 2023-11-15: 4 mg via INTRAVENOUS
  Filled 2023-11-14: qty 4

## 2023-11-14 MED ORDER — ACETAMINOPHEN 10 MG/ML IV SOLN
1000.0000 mg | Freq: Once | INTRAVENOUS | Status: AC
  Administered 2023-11-14: 1000 mg via INTRAVENOUS
  Filled 2023-11-14: qty 100

## 2023-11-14 MED ORDER — STERILE WATER FOR INJECTION IV SOLN
INTRAVENOUS | Status: DC
  Filled 2023-11-14 (×2): qty 150

## 2023-11-14 MED ORDER — DOCUSATE SODIUM 50 MG/5ML PO LIQD: 100.0000 mg | Freq: Two times a day (BID) | ORAL | Status: DC

## 2023-11-14 MED ORDER — PHENYLEPHRINE HCL-NACL 20-0.9 MG/250ML-% IV SOLN
0.0000 ug/min | INTRAVENOUS | Status: DC
  Administered 2023-11-14: 100 ug/min via INTRAVENOUS
  Administered 2023-11-14: 20 ug/min via INTRAVENOUS
  Filled 2023-11-14: qty 250

## 2023-11-14 MED ORDER — MORPHINE SULFATE (PF) 4 MG/ML IV SOLN
4.0000 mg | Freq: Once | INTRAVENOUS | Status: AC
  Administered 2023-11-14: 4 mg via INTRAVENOUS
  Filled 2023-11-14: qty 1

## 2023-11-14 MED ORDER — ONDANSETRON 4 MG PO TBDP
4.0000 mg | ORAL_TABLET | Freq: Once | ORAL | Status: AC | PRN
  Administered 2023-11-14: 4 mg via ORAL
  Filled 2023-11-14: qty 1

## 2023-11-14 MED ORDER — PROPOFOL 1000 MG/100ML IV EMUL
0.0000 ug/kg/min | INTRAVENOUS | Status: DC
  Administered 2023-11-14: 50 ug/kg/min via INTRAVENOUS

## 2023-11-14 MED ORDER — ROCURONIUM BROMIDE 10 MG/ML (PF) SYRINGE
PREFILLED_SYRINGE | INTRAVENOUS | Status: AC
  Filled 2023-11-14: qty 10

## 2023-11-14 MED ORDER — FENTANYL CITRATE (PF) 100 MCG/2ML IJ SOLN
INTRAMUSCULAR | Status: AC
Start: 2023-11-14 — End: 2023-11-14
  Filled 2023-11-14: qty 2

## 2023-11-14 MED ORDER — EPINEPHRINE PF 1 MG/ML IJ SOLN
INTRAMUSCULAR | Status: AC
  Filled 2023-11-14: qty 1

## 2023-11-14 MED ORDER — PANTOPRAZOLE SODIUM 40 MG IV SOLR
40.0000 mg | INTRAVENOUS | Status: DC
  Administered 2023-11-14: 40 mg via INTRAVENOUS
  Filled 2023-11-14: qty 10

## 2023-11-14 MED ORDER — BUPIVACAINE-MELOXICAM ER 200-6 MG/7ML IJ SOLN
INTRAMUSCULAR | Status: AC
  Filled 2023-11-14: qty 1

## 2023-11-14 MED ORDER — SODIUM CHLORIDE 0.9 % IV BOLUS
1000.0000 mL | Freq: Once | INTRAVENOUS | Status: AC
  Administered 2023-11-14: 1000 mL via INTRAVENOUS

## 2023-11-14 MED ORDER — FENTANYL CITRATE (PF) 100 MCG/2ML IJ SOLN
INTRAMUSCULAR | Status: DC | PRN
  Administered 2023-11-14: 50 ug via INTRAVENOUS

## 2023-11-14 MED ORDER — PROPOFOL 500 MG/50ML IV EMUL
INTRAVENOUS | Status: DC | PRN
  Administered 2023-11-14: 50 ug/kg/min via INTRAVENOUS

## 2023-11-14 MED ORDER — NOREPINEPHRINE 4 MG/250ML-% IV SOLN
INTRAVENOUS | Status: DC | PRN
  Administered 2023-11-14: 2 ug/min via INTRAVENOUS

## 2023-11-14 MED ORDER — ALBUMIN HUMAN 5 % IV SOLN: INTRAVENOUS | Status: DC | PRN

## 2023-11-14 MED ORDER — FAMOTIDINE 20 MG PO TABS
20.0000 mg | ORAL_TABLET | Freq: Two times a day (BID) | ORAL | Status: DC
Start: 2023-11-14 — End: 2023-11-15

## 2023-11-14 MED ORDER — POLYETHYLENE GLYCOL 3350 17 G PO PACK: 17.0000 g | PACK | Freq: Every day | ORAL | Status: DC

## 2023-11-14 MED ORDER — ROCURONIUM BROMIDE 100 MG/10ML IV SOLN
INTRAVENOUS | Status: DC | PRN
  Administered 2023-11-14: 20 mg via INTRAVENOUS
  Administered 2023-11-14: 30 mg via INTRAVENOUS
  Administered 2023-11-14: 20 mg via INTRAVENOUS

## 2023-11-14 MED ORDER — IOHEXOL 300 MG/ML  SOLN
100.0000 mL | Freq: Once | INTRAMUSCULAR | Status: AC | PRN
  Administered 2023-11-14: 100 mL via INTRAVENOUS

## 2023-11-14 MED ORDER — FENTANYL 2500MCG IN NS 250ML (10MCG/ML) PREMIX INFUSION
0.0000 ug/h | INTRAVENOUS | Status: DC
  Administered 2023-11-14: 50 ug/h via INTRAVENOUS
  Administered 2023-11-15: 250 ug/h via INTRAVENOUS
  Filled 2023-11-14 (×2): qty 250

## 2023-11-14 MED ORDER — CALCIUM CHLORIDE 10 % IV SOLN
INTRAVENOUS | Status: AC
  Filled 2023-11-14: qty 10

## 2023-11-14 MED ORDER — SODIUM CHLORIDE 0.9% FLUSH: 10.0000 mL | INTRAVENOUS | Status: DC | PRN

## 2023-11-14 MED ORDER — ORAL CARE MOUTH RINSE
15.0000 mL | OROMUCOSAL | Status: DC | PRN
Start: 2023-11-14 — End: 2023-11-15

## 2023-11-14 MED ORDER — SODIUM CHLORIDE 0.9 % IV SOLN
INTRAVENOUS | Status: DC | PRN
Start: 2023-11-14 — End: 2023-11-14

## 2023-11-14 MED ORDER — NOREPINEPHRINE 4 MG/250ML-% IV SOLN
0.0000 ug/min | INTRAVENOUS | Status: DC
  Administered 2023-11-14: 2 ug/min via INTRAVENOUS

## 2023-11-14 MED ORDER — SODIUM CHLORIDE 0.9 % IV SOLN
0.0000 ug/min | INTRAVENOUS | Status: DC
  Administered 2023-11-14: 200 ug/min via INTRAVENOUS
  Administered 2023-11-15: 280 ug/min via INTRAVENOUS
  Administered 2023-11-15: 330 ug/min via INTRAVENOUS
  Filled 2023-11-14 (×3): qty 10

## 2023-11-14 MED ORDER — PHENYLEPHRINE CONCENTRATED 100MG/250ML (0.4 MG/ML) INFUSION SIMPLE
0.0000 ug/min | INTRAVENOUS | Status: DC
  Filled 2023-11-14: qty 250

## 2023-11-14 MED ORDER — LACTATED RINGERS IV SOLN: INTRAVENOUS | Status: DC | PRN

## 2023-11-14 SURGICAL SUPPLY — 43 items
BASIN KIT SINGLE STR (MISCELLANEOUS) IMPLANT
BLADE SURG 10 STRL SS (BLADE) IMPLANT
CANISTER WOUND CARE 500ML ATS (WOUND CARE) IMPLANT
CHLORAPREP W/TINT 26 (MISCELLANEOUS) IMPLANT
DRAPE LAPAROTOMY 100X77 ABD (DRAPES) ×1 IMPLANT
DRSG OPSITE POSTOP 4X10 (GAUZE/BANDAGES/DRESSINGS) IMPLANT
DRSG OPSITE POSTOP 4X8 (GAUZE/BANDAGES/DRESSINGS) IMPLANT
ELECT BLADE 6.5 EXT (BLADE) IMPLANT
ELECTRODE REM PT RTRN 9FT ADLT (ELECTROSURGICAL) ×1 IMPLANT
GAUZE 4X4 16PLY ~~LOC~~+RFID DBL (SPONGE) IMPLANT
GLOVE BIOGEL PI IND STRL 7.0 (GLOVE) ×1 IMPLANT
GLOVE SURG SYN 6.5 PF PI (GLOVE) ×3 IMPLANT
GOWN STRL REUS W/ TWL LRG LVL3 (GOWN DISPOSABLE) ×3 IMPLANT
HANDLE SUCTION POOLE (INSTRUMENTS) IMPLANT
KIT TURNOVER KIT A (KITS) ×1 IMPLANT
LABEL OR SOLS (LABEL) ×1 IMPLANT
LIGASURE IMPACT 36 18CM CVD LR (INSTRUMENTS) IMPLANT
MANIFOLD NEPTUNE II (INSTRUMENTS) ×1 IMPLANT
NDL HYPO 22X1.5 SAFETY MO (MISCELLANEOUS) IMPLANT
NEEDLE HYPO 22X1.5 SAFETY MO (MISCELLANEOUS) IMPLANT
NS IRRIG 1000ML POUR BTL (IV SOLUTION) ×1 IMPLANT
PACK BASIN MAJOR ARMC (MISCELLANEOUS) ×1 IMPLANT
PACK COLON CLEAN CLOSURE (MISCELLANEOUS) IMPLANT
RELOAD PROXIMATE 75MM BLUE (ENDOMECHANICALS) ×1 IMPLANT
RELOAD STAPLE 75 3.8 BLU REG (ENDOMECHANICALS) IMPLANT
SPONGE ABDOMINAL VAC ABTHERA (MISCELLANEOUS) IMPLANT
SPONGE T-LAP 18X18 ~~LOC~~+RFID (SPONGE) IMPLANT
STAPLER PROXIMATE 75MM BLUE (STAPLE) IMPLANT
STAPLER SKIN PROX 35W (STAPLE) ×1 IMPLANT
SUT PDS AB 1 TP1 54 (SUTURE) ×1 IMPLANT
SUT SILK 2 0SH CR/8 30 (SUTURE) IMPLANT
SUT SILK 2-0 18XBRD TIE 12 (SUTURE) IMPLANT
SUT SILK 2-0 30XBRD TIE 12 (SUTURE) IMPLANT
SUT SILK 3 0 SH CR/8 (SUTURE) IMPLANT
SUT SILK 3-0 18XBRD TIE 12 (SUTURE) IMPLANT
SUT VIC AB 3-0 SH 27X BRD (SUTURE) IMPLANT
SYR 20ML LL LF (SYRINGE) IMPLANT
SYR BULB IRRIG 60ML STRL (SYRINGE) IMPLANT
TOWEL OR 17X26 4PK STRL BLUE (TOWEL DISPOSABLE) IMPLANT
TRAP FLUID SMOKE EVACUATOR (MISCELLANEOUS) ×1 IMPLANT
TRAY FOLEY MTR SLVR 16FR STAT (SET/KITS/TRAYS/PACK) ×1 IMPLANT
TRAY FOLEY SLVR 16FR LF STAT (SET/KITS/TRAYS/PACK) IMPLANT
WATER STERILE IRR 500ML POUR (IV SOLUTION) ×1 IMPLANT

## 2023-11-14 NOTE — Anesthesia Preprocedure Evaluation (Signed)
 Anesthesia Evaluation  Patient identified by MRN, date of birth, ID band Patient awake and Patient unresponsive    Reviewed: Allergy & Precautions, NPO status , Patient's Chart, lab work & pertinent test results, Unable to perform ROS - Chart review onlyPreop documentation limited or incomplete due to emergent nature of procedure.  Airway Mallampati: II  TM Distance: >3 FB Neck ROM: full    Dental  (+) Teeth Intact   Pulmonary neg pulmonary ROS   Pulmonary exam normal  + decreased breath sounds      Cardiovascular negative cardio ROS Normal cardiovascular exam Rhythm:Regular Rate:Tachycardia     Neuro/Psych Seizures -,        Hx of cerebral PalsyHx of cerebral palsy negative neurological ROS  negative psych ROS   GI/Hepatic negative GI ROS, Neg liver ROS,,,Hx of Nissn Fundoplication, possible bowel obstruction vs abdominal compartment syndrome.   Endo/Other  negative endocrine ROS  Class 3 obesity  Renal/GU negative Renal ROS     Musculoskeletal   Abdominal  (+) + obese  Peds  Hematology negative hematology ROS (+)   Anesthesia Other Findings Past Medical History: No date: CP (cerebral palsy) (HCC) No date: Seizures (HCC)  Past Surgical History: No date: ABDOMINAL SURGERY  BMI    Body Mass Index: 28.17 kg/m      Reproductive/Obstetrics negative OB ROS                              Anesthesia Physical Anesthesia Plan  ASA: 4 and emergent  Anesthesia Plan: General   Post-op Pain Management:    Induction: Intravenous  PONV Risk Score and Plan: Ondansetron , Dexamethasone, Midazolam  and Treatment may vary due to age or medical condition  Airway Management Planned: Oral ETT  Additional Equipment: Arterial line  Intra-op Plan:   Post-operative Plan: Possible Post-op intubation/ventilation  Informed Consent: I have reviewed the patients History and Physical, chart, labs and  discussed the procedure including the risks, benefits and alternatives for the proposed anesthesia with the patient or authorized representative who has indicated his/her understanding and acceptance.     History available from chart only and Only emergency history available  Plan Discussed with: Anesthesiologist, CRNA and Surgeon  Anesthesia Plan Comments:         Anesthesia Quick Evaluation

## 2023-11-14 NOTE — ED Triage Notes (Signed)
 Pt c/o generalized abdominal pain and n/v/d starting last night.  Pain score 10/10.  Hx of Cerebral palsy.

## 2023-11-14 NOTE — Progress Notes (Signed)
 Elink following for sepsis protocol.

## 2023-11-14 NOTE — H&P (Signed)
 Subjective:   CC: Acute abdomen  HPI:  Bradley Butler is a 38 y.o. male who is consulted by [facility] for evaluation of above cc.  Per mother at bedside, abdominal pain started at midnight.  Continue to get worse with distention and firmness.  In the emergency department, patient continued to decline clinically where patient was initially noted to be walking around but then was unable to due to the pain.  Increasing heart rate and blood pressure stable however clinically noted to have cyanosis in lower extremities and lower torso area.    Patient unable to provide any further information secondary to his cerebral palsy. Mother states patient has not been on anticoagulation, no risk for blood clots, and no other acute medical concerns recently.  Past Medical History:  has a past medical history of CP (cerebral palsy) (HCC) and Seizures (HCC).  Past Surgical History:  has a past surgical history that includes Abdominal surgery.  Family History: family history is not on file.  Social History:  reports that he has never smoked. He has never used smokeless tobacco. He reports that he does not drink alcohol  and does not use drugs.  Current Medications:  Prior to Admission medications   Medication Sig Start Date End Date Taking? Authorizing Provider  cyclobenzaprine  (FLEXERIL ) 10 MG tablet Take 1 tablet (10 mg total) by mouth 3 (three) times daily as needed for muscle spasms. 04/11/17   Triplett, Cari B, FNP  LYRICA 100 MG capsule Take 1 capsule by mouth 3 (three) times daily. 03/29/16   [provider]  naproxen  (NAPROSYN ) 500 MG tablet Take 1 tablet (500 mg total) by mouth 2 (two) times daily with a meal. 04/11/17   Triplett, Cari B, FNP  VIMPAT 200 MG TABS tablet Take 1 tablet by mouth 2 (two) times daily. 03/29/16   [provider]  Vitamin D, Ergocalciferol, (DRISDOL) 50000 units CAPS capsule Take 1 capsule by mouth every 7 (seven) days. 03/22/16   [provider]     Allergies:  Allergies as of 11/14/2023 - Review Complete 11/14/2023  Allergen Reaction Noted   Levaquin [levofloxacin in d5w]  03/31/2016    ROS:  Pertinent positives and negatives noted in HPI   Objective:     BP (!) 174/122   Pulse (!) 124   Temp (!) 97.5 F (36.4 C) (Oral)   Resp (!) 29   Ht 5' 2 (1.575 m)   Wt 69.9 kg   SpO2 96%   BMI 28.17 kg/m    Constitutional :  severe distress  Respiratory:  Clear to auscultation bilaterally  Cardiovascular:  Regular rate and rhythm  Gastrointestinal: Acute abdomen with firmness diffuse mottling and cyanosis of the lower dorsal.   Skin: Cool to touch lower extremities.  Psychiatric: Normal affect, non-agitated, not confused       LABS:     Latest Ref Rng & Units 11/14/2023    9:48 AM 03/31/2016    8:05 PM  CMP  Glucose 70 - 99 mg/dL 790  889   BUN 6 - 20 mg/dL 29  15   Creatinine 9.38 - 1.24 mg/dL 8.52  8.80   Sodium 864 - 145 mmol/L 141  133   Potassium 3.5 - 5.1 mmol/L 3.8  3.8   Chloride 98 - 111 mmol/L 102  100   CO2 22 - 32 mmol/L 21  25   Calcium  8.9 - 10.3 mg/dL 89.8  9.2   Total Protein 6.5 - 8.1 g/dL 8.7  Total Bilirubin 0.0 - 1.2 mg/dL 1.1    Alkaline Phos 38 - 126 U/L 93    AST 15 - 41 U/L 49    ALT 0 - 44 U/L 83        Latest Ref Rng & Units 11/14/2023    9:48 AM 03/31/2016   10:59 PM 03/31/2016    8:05 PM  CBC  WBC 4.0 - 10.5 K/uL 11.0  10.7  9.6   Hemoglobin 13.0 - 17.0 g/dL 79.8  82.9  80.3   Hematocrit 39.0 - 52.0 % 60.4  49.7  56.7   Platelets 150 - 400 K/uL 256  180  185      RADS: CLINICAL DATA:  Acute abdominal pain, nausea, vomiting, diarrhea, history of prior G-tube in Nissen fundoplication as a child, history of cerebral palsy   EXAM: CT ABDOMEN AND PELVIS WITH CONTRAST   TECHNIQUE: Multidetector CT imaging of the abdomen and pelvis was performed using the standard protocol following bolus administration of intravenous contrast.   RADIATION DOSE REDUCTION: This exam  was performed according to the departmental dose-optimization program which includes automated exposure control, adjustment of the mA and/or kV according to patient size and/or use of iterative reconstruction technique.   CONTRAST:  OMNIPAQUE  IOHEXOL  300 MG/ML  SOLN   COMPARISON:  July 21, 2018 ultrasound   FINDINGS: Lower chest: Unremarkable   Hepatobiliary: Branching locules of gas concerning for portal venous gas.   Pancreas: Unremarkable.   Spleen: Unremarkable.   Adrenals/Urinary Tract: Adrenal glands are unremarkable. Small left renal cyst an additional left renal hypodensities are too small to characterize. No hydronephrosis. No nephrolithiasis.   Stomach/Bowel: Multiple dilated loops of small bowel and stomach with focal transition point at the level of the terminal ileum concerning for distal small bowel obstruction. The colon is relatively decompressed. Small locules of air visualized in the superior mesenteric vein with findings concerning for portal venous gas concerning for bowel ischemia. No definite bowel wall pneumatosis.   Vascular/Lymphatic: Portal vein, splenic vein, and SMV are patent with portal venous gas.   Reproductive: Unremarkable.   Other: No abdominal wall hernia or abnormality. No abdominopelvic ascites.   Musculoskeletal: No acute osseous findings.   IMPRESSION: 1. Findings concerning for distal small bowel obstruction with transition point at the level of the terminal ileum. No definite bowel wall pneumatosis, although there is portal venous gas, concerning for bowel ischemia. Recommend surgical consultation.   These results were called by telephone at the time of interpretation on 11/14/2023 at 12:14 pm to provider [facility] , who verbally acknowledged these results.     Electronically Signed   By: Michaeline Blanch M.D.   On: 11/14/2023 12:27  Assessment:   Acute abdomen, concern for bowel ischemia and now clinical  concerns for abdominal compartment syndrome  Plan:   Emergency surgery and explained to mother that surgery will be the only option for patient to survive.  She verbalized understanding and is fully agreeable to proceeding, understanding the very high mortality possibility.   Initially notified by ED department at 12:22 PM, while in another case.  Emergency department called second time 14 minutes later stating rapid deterioration in clinical presentation.  At that time, oral was instructed to prepare for exploratory laparotomy while I was finishing the case.  Previous case completed, assessment and plan completed by 1:22 PM.  labs/images/medications/previous chart entries reviewed personally and relevant changes/updates noted above.

## 2023-11-14 NOTE — Code Documentation (Signed)
 CODE SEPSIS - PHARMACY COMMUNICATION  **Broad Spectrum Antibiotics should be administered within 1 hour of Sepsis diagnosis**  Time Code Sepsis Called/Page Received: 1229  Antibiotics Ordered: Zosyn   Time of 1st antibiotic administration: 1242  Additional action taken by pharmacy: none required  If necessary, Name of Provider/Nurse Contacted: N/A    Adriana JONETTA Bolster ,PharmD Clinical Pharmacist  11/14/2023  12:40 PM

## 2023-11-14 NOTE — Anesthesia Procedure Notes (Addendum)
 Procedure Name: Intubation Date/Time: 11/14/2023 1:27 PM  Performed by: Elly Pfeiffer, CRNAPre-anesthesia Checklist: Patient identified, Emergency Drugs available, Suction available and Patient being monitored Patient Re-evaluated:Patient Re-evaluated prior to induction Oxygen Delivery Method: Circle system utilized Preoxygenation: Pre-oxygenation with 100% oxygen Induction Type: IV induction, Cricoid Pressure applied and Rapid sequence Laryngoscope Size: McGrath and 4 Grade View: Grade I Tube type: Oral Tube size: 7.5 mm Number of attempts: 1 Airway Equipment and Method: Stylet Placement Confirmation: ETT inserted through vocal cords under direct vision, positive ETCO2 and breath sounds checked- equal and bilateral Secured at: 24 cm Tube secured with: Tape Dental Injury: Teeth and Oropharynx as per pre-operative assessment  Comments: Cords clear. CA

## 2023-11-14 NOTE — Progress Notes (Signed)
   11/14/23 1230  Spiritual Encounters  Type of Visit Initial  Care provided to: [facility] partners present during encounter Nurse  Referral source Nurse (RN/NT/LPN)  Reason for visit Urgent spiritual support  OnCall Visit Yes  Spiritual Framework  Presenting Themes Significant life change;Impactful experiences and emotions  Interventions  Spiritual Care Interventions Made Established relationship of care and support;Compassionate presence;Reflective listening;Narrative/life review;Prayer;Encouragement  Intervention Outcomes  Outcomes Connection to spiritual care;Awareness around self/spiritual resourses;Awareness of support  Spiritual Care Plan  Spiritual Care Issues Still Outstanding Referring to oncoming chaplain for further support (Made Daun Lesches aware of the situation)   Chaplain sat with family until pt was out of emergency surgery and then took them to the ICU waiting room. Chaplain let ICU team know family was in waiting room. Chaplain prayed with family and checked on them periodically while they waited.

## 2023-11-14 NOTE — Progress Notes (Signed)
 Peripherally Inserted Central Catheter Placement  The IV Nurse has discussed with the patient and/or persons authorized to consent for the patient, the purpose of this procedure and the potential benefits and risks involved with this procedure.  The benefits include less needle sticks, lab draws from the catheter, and the patient may be discharged home with the catheter. Risks include, but not limited to, infection, bleeding, blood clot (thrombus formation), and puncture of an artery; nerve damage and irregular heartbeat and possibility to perform a PICC exchange if needed/ordered by physician.  Alternatives to this procedure were also discussed.  Bard Power PICC patient education guide, fact sheet on infection prevention and patient information card has been provided to patient /or left at bedside. Consent obtained by Aunt at bedside.   PICC Placement Documentation  PICC Triple Lumen 11/14/23 Right Brachial 40 cm 0 cm (Active)  Indication for Insertion or Continuance of Line Vasoactive infusions;Administration of hyperosmolar/irritating solutions (i.e. TPN, Vancomycin, etc.) 11/14/23 1900  Exposed Catheter (cm) 0 cm 11/14/23 1900  Site Assessment Clean, Dry, Intact 11/14/23 1900  Lumen #1 Status Flushed;Saline locked;Blood return noted 11/14/23 1900  Lumen #2 Status Flushed;Saline locked;Blood return noted 11/14/23 1900  Lumen #3 Status Flushed;Saline locked;Blood return noted 11/14/23 1900  Dressing Type Transparent;Securing device 11/14/23 1900  Dressing Status Antimicrobial disc/dressing in place 11/14/23 1900  Line Care Connections checked and tightened 11/14/23 1900  Line Adjustment (NICU/IV Team Only) No 11/14/23 1900  Dressing Intervention New dressing;Adhesive placed at insertion site (IV team only) 11/14/23 1900  Dressing Change Due 11/21/23 11/14/23 1900       Leita Shipper 11/14/2023, 7:23 PM

## 2023-11-14 NOTE — Op Note (Signed)
 Pre-Op Dx: Ischemic colitis and small bowel obstruction abdominal compartment syndrome,  Post-Op Dx: Same Anesthesia: GETA EBL: 100 mL Complications:  none apparent Specimen: Small bowel Procedure: Exploratory laparotomy, lysis of adhesions, small bowel resection Surgeon: Tye Assistant: Marinda for additional exposure and aid of bowel resection  Indications for procedure: Patient with acute abdomen and concerns for ischemic colitis on imaging.  Emergently taken to the OR due to rapidly deteriorating clinical exam.  At time of procedure, patient heart rate noted to be tachycardic with obvious mottling and cyanosis of the entire lower half of his body.  Abdomen remained extremely distended and hard to palpation consistent with acute abdomen.  Description of Procedure:  Consent obtained, time out performed.  Patient placed in supine position.  midline incision made and dissection carried down to fascia.  Fascia then incised and peritoneum entered.  Immediate visualization of extremely dilated small bowel contents noted.  The most dilated part of the small bowel was noted to be ischemic with area of necrosis starting to form.  Infection present within the abdomen due to the necrotic nature of the bowel.    Visualized small bowel was carefully removed out of the abdominal cavity.  A portion of the most proximal aspect was noted to be tethered in the area of possibly the ligament of Treitz.  Dilated bowel was noted distal to this area all the way to the proximal ileum where it slowly tapered down to more healthier looking small bowel to the cecum.  Additional manipulation attempted to visualize the  tethered area better but this was Unsuccessful due to the extremely dilated nature of the bowel.  Additional manipulation attempted but enteric contents spillage noted from an enteric tear at this point.  The tear was located in the dilated bowel was decompressed using suction as much as possible.  Spilled  enteric contents contained as much as possible with towels and additional suction.  Silk sutures used to close the tear.  After decompression of the small bowel, the tethered area was able to be visualized and there was noted to be a small loop of bowel adhered densely to the mesentery and surrounding tissue.  This loop of bowel slowly separated from surrounding tissue using Metzenbaum scissors and gentle traction.  Once the bowel was freed from surrounding tissue, the ischemic bowel was able to be traced back to the ligament of Treitz.  Bleeding from the mesenteric side around the area of adhesion controlled with 2-0 silk suture ligation.  No other obvious areas or causes of bowel obstruction and extreme dilation and ischemia of bowel observed.  Patient continued to require increased pressor usage, and due to the severely injured nature of the majority of small bowel, decision made to proceed with resection of the most injured segment, leave  abdomen open for abdominal compartment syndrome concerns , And return to the OR for a second look at a later time.  Area was somewhat healthier looking bowel segment noted and a 75 mm GIA blue load used to transect the bowel segment.  The segment mesentery then transected using vessel sealer proximal to the serosal tear in the most diseased bowel portion, which was slightly more proximal to the area tethering previously noted.  75 mm blue load stapler then used here to transect the bowel and specimen passed off operative field pending pathology.  Reinspection of the area noted no active signs of bleeding or obvious enteric contents.  Abdominal cavity then extensively irrigated prior to placing ABThera wound  VAC over bowel, additional sponge placed within the incision site and secured to the skin using staples.  DME wound VAC then applied to the sponge 125 mmHg negative pressure.  Sponge and instrument count correct and confirmed prior to placement of the back above.   After VAC was placed, patient directly transported to ICU intubated and still in critical condition.

## 2023-11-14 NOTE — ED Provider Notes (Addendum)
 [facility] Provider Note    Event Date/Time   First MD Initiated Contact with Patient 11/14/23 1003     (approximate)   History   Abdominal Pain, Emesis, and Diarrhea   HPI  Bradley Butler is a 38 y.o. male past medical history significant for cerebral palsy, asthma, seizure disorder on Vimpat, OSA, who presents to the emergency department with abdominal pain.  Abdominal pain that started last night with nausea and vomiting.  Small amount of bowel movement prior to arrival.  Mother states that he has had issues with constipation in the past but has never had a distended abdomen or small bowel obstruction in the past.  Had a Niesen fundoplication as an infant and prior G-tubes that have thus been removed.  No fever or chills.     Physical Exam   Triage Vital Signs: ED Triage Vitals  Encounter Vitals Group     BP 11/14/23 0941 (!) 140/109     Girls Systolic BP Percentile --      Girls Diastolic BP Percentile --      Boys Systolic BP Percentile --      Boys Diastolic BP Percentile --      Pulse Rate 11/14/23 0941 72     Resp 11/14/23 0941 (!) 22     Temp 11/14/23 0941 (!) 97.5 F (36.4 C)     Temp Source 11/14/23 0941 Oral     SpO2 11/14/23 0941 99 %     Weight 11/14/23 0942 154 lb (69.9 kg)     Height 11/14/23 0942 5' 2 (1.575 m)     Head Circumference --      Peak Flow --      Pain Score 11/14/23 0942 10     Pain Loc --      Pain Education --      Exclude from Growth Chart --     Most recent vital signs: Vitals:   11/14/23 1247 11/14/23 1300  BP: (!) 167/121 (!) 174/122  Pulse: (!) 116 (!) 124  Resp: (!) 30 (!) 29  Temp:    SpO2: 96%     Physical Exam Constitutional:      Appearance: He is well-developed.  HENT:     Head: Atraumatic.  Eyes:     Conjunctiva/sclera: Conjunctivae normal.  Cardiovascular:     Rate and Rhythm: Regular rhythm. Tachycardia present.  Pulmonary:     Effort: No respiratory distress.  Abdominal:      Tenderness: There is abdominal tenderness.     Comments: Abdomen is distended and hard.  Diffuse tenderness to palpation  Musculoskeletal:     Cervical back: Normal range of motion.  Skin:    General: Skin is warm.     Capillary Refill: Capillary refill takes less than 2 seconds.  Neurological:     General: No focal deficit present.     Mental Status: He is alert. Mental status is at baseline.     IMPRESSION / MDM / ASSESSMENT AND PLAN / ED COURSE  I reviewed the triage vital signs and the nursing notes.  Differential diagnosis including perforated bowel, small bowel obstruction, electrolyte abnormality, dehydration, constipation, intra-abdominal abscess, appendicitis  Patient was given IV fluids, antiemetics and IV pain medication   Sinus tachycardia while on cardiac telemetry.  RADIOLOGY I independently reviewed imaging, my interpretation of imaging: CT scan with findings concerning for small bowel obstruction.  Called and discussed with the radiologist, concern for small bowel obstruction and  necrotic bowel  LABS (all labs ordered are listed, but only abnormal results are displayed) Labs interpreted as -    Labs Reviewed  LIPASE, BLOOD - Abnormal; Notable for the following components:      Result Value   Lipase 65 (*)    All other components within normal limits  COMPREHENSIVE METABOLIC PANEL WITH GFR - Abnormal; Notable for the following components:   CO2 21 (*)    Glucose, Bld 209 (*)    BUN 29 (*)    Creatinine, Ser 1.47 (*)    Total Protein 8.7 (*)    Albumin  5.2 (*)    AST 49 (*)    ALT 83 (*)    Anion gap 18 (*)    All other components within normal limits  CBC - Abnormal; Notable for the following components:   WBC 11.0 (*)    RBC 6.75 (*)    Hemoglobin 20.1 (*)    HCT 60.4 (*)    All other components within normal limits  LACTIC ACID, PLASMA - Abnormal; Notable for the following components:   Lactic Acid, Venous 4.5 (*)    All other components within  normal limits  CULTURE, BLOOD (ROUTINE X 2)  CULTURE, BLOOD (ROUTINE X 2)  BETA-HYDROXYBUTYRIC ACID  URINALYSIS, ROUTINE W REFLEX MICROSCOPIC  LACTIC ACID, PLASMA  SURGICAL PATHOLOGY     MDM  My initial evaluation concerning for small bowel obstruction.  Ordered CT scan.  Lab work with mild leukocytosis.  Elevated hemoglobin level.  Multiple attempts to pass NG tube and was unsuccessful likely in the setting of his prior Niesen fundoplication surgery.   Clinical Course as of 11/14/23 1423  Fri Nov 14, 2023  1233 When I reviewed CT scan consulted general surgery.  Attempting to put in an NG tube.  Concern for ischemic bowel.  Significantly tense abdomen.  Given IV Zosyn .  Called and received phone call from radiology for concern for ischemic bowel and small bowel obstruction.  Again messaged general surgery. [SM]  1237 Dr. Tye is currently in the operating room and stated that he would call after surgery.  Requested a phone call back immediately given the critical nature of the patient. [SM]    Clinical Course User Index [SM] Suzanne Kirsch, MD   Significant change of the patient patient now diaphoretic and appears mottled, tachycardic, hypertensive, requiring a nonrebreather given significant hypoxia.  Again called general surgery to discuss significant concern of the patient and the need for an emergent surgery.  They are actively in the OR with another patient and immediately came down to see the patient and take him to the OR. Did not delay antibiotics for blood cultures, critical nature of the patient.   Patient was given multiple doses of IV pain medication, antibiotics.  Lactic acid resulted at 4.5.  Taken to the OR in critical condition  PROCEDURES:  Critical Care performed: yes  .Critical Care  Performed by: Suzanne Kirsch, MD Authorized by: Suzanne Kirsch, MD   Critical care provider statement:    Critical care time (minutes):  50   Critical care time was exclusive  of:  Separately billable procedures and treating other patients   Critical care was time spent personally by me on the following activities:  Development of treatment plan with patient or surrogate, discussions with consultants, evaluation of patient's response to treatment, examination of patient, ordering and review of laboratory studies, ordering and review of radiographic studies, ordering and performing treatments and interventions, pulse oximetry,  re-evaluation of patient's condition and review of old charts   Patient's presentation is most consistent with acute presentation with potential threat to life or bodily function.   MEDICATIONS ORDERED IN ED: Medications  lactated ringers  infusion (has no administration in time range)  ondansetron  (ZOFRAN -ODT) disintegrating tablet 4 mg (4 mg Oral Given 11/14/23 0949)  morphine  (PF) 4 MG/ML injection 4 mg (4 mg Intravenous Given 11/14/23 1046)  ondansetron  (ZOFRAN ) injection 4 mg (4 mg Intravenous Given 11/14/23 1045)  sodium chloride  0.9 % bolus 1,000 mL (0 mLs Intravenous Stopped 11/14/23 1242)  ondansetron  (ZOFRAN ) injection 4 mg (4 mg Intravenous Given 11/14/23 1124)  ondansetron  (ZOFRAN ) injection 4 mg (4 mg Intravenous Given 11/14/23 1120)  iohexol  (OMNIPAQUE ) 300 MG/ML solution 100 mL (100 mLs Intravenous Contrast Given 11/14/23 1128)  HYDROmorphone  (DILAUDID ) injection 0.5 mg (0.5 mg Intravenous Given 11/14/23 1143)  piperacillin -tazobactam (ZOSYN ) IVPB 3.375 g (0 g Intravenous Stopped 11/14/23 1248)  sodium chloride  0.9 % bolus 500 mL (500 mLs Intravenous New Bag/Given 11/14/23 1251)    FINAL CLINICAL IMPRESSION(S) / ED DIAGNOSES   Final diagnoses:  Ischemic necrosis of small bowel (HCC)  SBO (small bowel obstruction) (HCC)     Rx / DC Orders   ED Discharge Orders     None        Note:  This document was prepared using Dragon voice recognition software and may include unintentional dictation errors.   Suzanne Kirsch,  MD 11/14/23 1423    Suzanne Kirsch, MD 11/14/23 1758

## 2023-11-14 NOTE — Interval H&P Note (Signed)
 History and Physical Interval Note:  11/14/2023 1:23 PM  Bradley Butler  has presented today for surgery, with the diagnosis of free air.  The various methods of treatment have been discussed with the patient and family. After consideration of risks, benefits and other options for treatment, the patient has consented to  Procedure(s): LAPAROTOMY, EXPLORATORY (N/A) as a surgical intervention.  The patient's history has been reviewed, patient examined, no change in status, stable for surgery.  I have reviewed the patient's chart and labs.  Questions were answered to the patient's satisfaction.     [name]

## 2023-11-14 NOTE — Progress Notes (Signed)
   11/14/23 1900  Spiritual Encounters  Type of Visit Initial  Care provided to: Family  Reason for visit Routine spiritual support  OnCall Visit Yes  Spiritual Framework  Family Stress Factors Exhausted  Interventions  Spiritual Care Interventions Made Compassionate presence;Encouragement  Intervention Outcomes  Outcomes Connection to spiritual care  Spiritual Care Plan  Spiritual Care Issues Still Outstanding Referring to oncoming chaplain for further support   Chaplain provided comfort to the family and compassionate presence.

## 2023-11-14 NOTE — Progress Notes (Signed)
 Pt remains febrile after IV tylenol  given. Ice packs placed, fan over patient

## 2023-11-14 NOTE — ED Notes (Signed)
 Skin cyanotic from diaphragm to toes in last 30 mins

## 2023-11-14 NOTE — Consult Note (Signed)
 NAME:  Bradley Butler, MRN:  [id number], DOB:  21-Jul-1985, LOS: 0 ADMISSION DATE:  11/14/2023  CHIEF COMPLAINT:  RESP FAILURE   BRIEF SYNOPSIS  History of Present Illness:  38 y.o. male past medical history significant for cerebral palsy, asthma, seizure disorder on Vimpat, OSA, who presents to the emergency department with abdominal pain.    Abdominal pain that started last night with nausea and vomiting.   Small amount of bowel movement prior to arrival.  Mother states that he has had issues with constipation in the past but has never had a distended abdomen or small bowel obstruction in the past.  Had a Niesen fundoplication as an infant and prior G-tubes that have thus been removed.  No fever or chills.  Patient found to have  acute ABD with free air acute BOWEL ischemia Patient taken to OR emergently for ex-lap, open wound with wound vac Transferred to ICU for post op resp failure due to shock and sepsis and metabolic acidosis  Significant Hospital Events: Including procedures, antibiotic start and stop dates in addition to other pertinent events   7/11 admitted for acute sepsis/abd perf with abd ischemia s/p ex lap      Micro Data:  pending  Antimicrobials:   Antibiotics Given (last 72 hours)     Date/Time Action Medication Dose Rate   11/14/23 1242 New Bag/Given   piperacillin -tazobactam (ZOSYN ) IVPB 3.375 g 3.375 g 100 mL/hr             Objective   Blood pressure 115/78, pulse (!) 101, temperature (!) 95.9 F (35.5 C), temperature source Rectal, resp. rate 18, height 5' 2 (1.575 m), weight 69.9 kg, SpO2 99%.    Vent Mode: PRVC FiO2 (%):  [60 %] 60 % Set Rate:  [18 bmp] 18 bmp Vt Set:  [500 mL] 500 mL PEEP:  [5 cmH20] 5 cmH20   Intake/Output Summary (Last 24 hours) at 11/14/2023 1710 Last data filed at 11/14/2023 1601 Gross per 24 hour  Intake 3149.5 ml  Output 450 ml  Net 2699.5 ml   Filed Weights   11/14/23 0942  Weight: 69.9 kg    REVIEW  OF SYSTEMS  PATIENT IS UNABLE TO PROVIDE COMPLETE REVIEW OF SYSTEMS DUE TO SEVERE CRITICAL ILLNESS   PHYSICAL EXAMINATION:  GENERAL:critically ill appearing, +resp distress EYES: Pupils equal, round, reactive to light.  No scleral icterus.  MOUTH: Moist mucosal membrane. INTUBATED NECK: Supple.  PULMONARY: Lungs clear to auscultation, +rhonchi, +wheezing CARDIOVASCULAR: S1 and S2.  Regular rate and rhythm GASTROINTESTINAL: distended open abd with wound vac in place MUSCULOSKELETAL: No swelling, clubbing, or edema.  NEUROLOGIC: obtunded SKIN:normal, warm to touch, Capillary refill delayed  Pulses present bilaterally   Labs/imaging that I havepersonally reviewed  (right click and Reselect all SmartList Selections daily)      ASSESSMENT AND PLAN SYNOPSIS  38 yo male with acute abd ischemia and perforation with post op resp failure due to septic shock and severe metabolic acidosis   Severe ACUTE Hypoxic and Hypercapnic Respiratory Failure -continue Mechanical Ventilator support -continue Bronchodilator Therapy -Wean Fio2 and PEEP as tolerated -VAP/VENT bundle implementation  Vent Mode: PRVC FiO2 (%):  [60 %] 60 % Set Rate:  [18 bmp] 18 bmp Vt Set:  [500 mL] 500 mL PEEP:  [5 cmH20] 5 cmH20  CARDIAC ICU monitoring   ACUTE KIDNEY INJURY/Renal Failure -continue Foley Catheter-assess need -Avoid nephrotoxic agents -Follow urine output, BMP -Ensure adequate renal perfusion, optimize oxygenation -Renal dose medications  Intake/Output Summary (Last 24 hours) at 11/14/2023 1710 Last data filed at 11/14/2023 1601 Gross per 24 hour  Intake 3149.5 ml  Output 450 ml  Net 2699.5 ml      Latest Ref Rng & Units 11/14/2023    9:48 AM 03/31/2016    8:05 PM  BMP  Glucose 70 - 99 mg/dL 790  889   BUN 6 - 20 mg/dL 29  15   Creatinine 9.38 - 1.24 mg/dL 8.52  8.80   Sodium 864 - 145 mmol/L 141  133   Potassium 3.5 - 5.1 mmol/L 3.8  3.8   Chloride 98 - 111 mmol/L 102  100    CO2 22 - 32 mmol/L 21  25   Calcium  8.9 - 10.3 mg/dL 89.8  9.2      NEUROLOGY Acute  metabolic encephalopathy, need for sedation Goal RASS -2 to -3   SEPTIC SHOCK SOURCE-acute ABD perforated bowel ischemia -use vasopressors to keep MAP>65 as needed -follow ABG and LA -follow up cultures -emperic ABX -consider stress dose steroids -aggressive IV fluid resuscitation  INFECTIOUS DISEASE -continue antibiotics as prescribed -follow up cultures  ENDO - ICU hypoglycemic\Hyperglycemia protocol -check FSBS per protocol   GI GI PROPHYLAXIS as indicated  NUTRITIONAL STATUS DIET-->NPO Constipation protocol as indicated   ELECTROLYTES -follow labs as needed -replace as needed -pharmacy consultation and following     Best practice (right click and Reselect all SmartList Selections daily)  Diet:  NPO Pain/Anxiety/Delirium protocol (if indicated): Yes (RASS goal -3) VAP protocol (if indicated): Yes DVT prophylaxis: Subcutaneous Heparin GI prophylaxis: PPI Arterial line:  Yes, and it is still needed Foley:  Yes, and it is still needed Mobility:  bed rest  Code Status:  FULL CODE Disposition: ICU  Labs   CBC: Recent Labs  Lab 11/14/23 0948  WBC 11.0*  HGB 20.1*  HCT 60.4*  MCV 89.5  PLT 256    Basic Metabolic Panel: Recent Labs  Lab 11/14/23 0948  NA 141  K 3.8  CL 102  CO2 21*  GLUCOSE 209*  BUN 29*  CREATININE 1.47*  CALCIUM  10.1   GFR: Estimated Creatinine Clearance: 59.1 mL/min (A) (by C-G formula based on SCr of 1.47 mg/dL (H)). Recent Labs  Lab 11/14/23 0948 11/14/23 1244 11/14/23 1552  WBC 11.0*  --   --   LATICACIDVEN  --  4.5* 4.7*    Liver Function Tests: Recent Labs  Lab 11/14/23 0948  AST 49*  ALT 83*  ALKPHOS 93  BILITOT 1.1  PROT 8.7*  ALBUMIN  5.2*   Recent Labs  Lab 11/14/23 0948  LIPASE 65*   No results for input(s): AMMONIA in the last 168 hours.  ABG    Component Value Date/Time   PHART 7.09 (LL)  11/14/2023 1634   PCO2ART 43 11/14/2023 1634   PO2ART 95 11/14/2023 1634   HCO3 13.0 (L) 11/14/2023 1634   ACIDBASEDEF 16.4 (H) 11/14/2023 1634   O2SAT 97.9 11/14/2023 1634     Coagulation Profile: No results for input(s): INR, PROTIME in the last 168 hours.  Cardiac Enzymes: No results for input(s): CKTOTAL, CKMB, CKMBINDEX, TROPONINI in the last 168 hours.  HbA1C: No results found for: HGBA1C  CBG: Recent Labs  Lab 11/14/23 1517  GLUCAP 268*    Allergies Allergies  Allergen Reactions   Levaquin [Levofloxacin In D5w]        DVT/GI PRX  assessed I Assessed the need for Labs I Assessed the need for Foley I Assessed the  need for Central Venous Line Family Discussion when available I Assessed the need for Mobilization I made an Assessment of medications to be adjusted accordingly Safety Risk assessment completed  CASE DISCUSSED IN MULTIDISCIPLINARY ROUNDS WITH ICU TEAM     Critical Care Time devoted to patient care services described in this note is 85 minutes.  Critical care was necessary to treat or prevent imminent or life-threatening deterioration.   PATIENT WITH VERY POOR PROGNOSIS I ANTICIPATE PROLONGED ICU LOS  Patient with Multiorgan failure and at high risk for cardiac arrest and death.    Bradley Butler, M.D.  [facility]  Medical Director [facility] Medical Director [facility] Cardio-Pulmonary Department

## 2023-11-14 NOTE — Transfer of Care (Addendum)
 Immediate Anesthesia Transfer of Care Note  Patient: Bradley Butler  Procedure(s) Performed: LAPAROTOMY, EXPLORATORY (Abdomen)  Patient Location: PACU and ICU  Anesthesia Type:General  Level of Consciousness: sedated  Airway & Oxygen Therapy: Patient remains intubated per anesthesia plan  Post-op Assessment: Report given to RN and Post -op Vital signs reviewed and stable  Post vital signs: stable  Last Vitals:  Vitals Value Taken Time  BP 129/92 11/14/23 15:22  Temp    Pulse 111 11/14/23 15:24  Resp 13 11/14/23 15:26  SpO2 100 % 11/14/23 15:24  Vitals shown include unfiled device data.  Last Pain:  Unable to assess; patient sedated. CA       Complications: No notable events documented.

## 2023-11-14 NOTE — ED Notes (Signed)
 The pt's mother hit the pt's call bell and advised the pt was having some type of allergic reaction. The pt did have redness to his right forearm and upper arm. Dr. Levander and Dr. Suzanne was advised of the pt's concern. IV fluids were stopped.

## 2023-11-14 NOTE — ED Notes (Signed)
 Pt was sleeping at the time when I assessed him. The pt's mother advised he has been in a lot of pain.

## 2023-11-15 ENCOUNTER — Encounter: Admission: EM | Disposition: E | Payer: Self-pay | Source: Home / Self Care | Attending: Surgery

## 2023-11-15 ENCOUNTER — Encounter: Payer: Self-pay | Admitting: Anesthesiology

## 2023-11-15 DIAGNOSIS — K55029 Acute infarction of small intestine, extent unspecified: Secondary | ICD-10-CM | POA: Diagnosis not present

## 2023-11-15 DIAGNOSIS — K56609 Unspecified intestinal obstruction, unspecified as to partial versus complete obstruction: Secondary | ICD-10-CM | POA: Diagnosis not present

## 2023-11-15 DIAGNOSIS — J9601 Acute respiratory failure with hypoxia: Secondary | ICD-10-CM | POA: Diagnosis not present

## 2023-11-15 DIAGNOSIS — E872 Acidosis, unspecified: Secondary | ICD-10-CM | POA: Diagnosis not present

## 2023-11-15 LAB — CBC
HCT: 50.3 % (ref 39.0–52.0)
Hemoglobin: 16.4 g/dL (ref 13.0–17.0)
MCH: 30.4 pg (ref 26.0–34.0)
MCHC: 32.6 g/dL (ref 30.0–36.0)
MCV: 93.3 fL (ref 80.0–100.0)
Platelets: 143 K/uL — ABNORMAL LOW (ref 150–400)
RBC: 5.39 MIL/uL (ref 4.22–5.81)
RDW: 13.2 % (ref 11.5–15.5)
WBC: 5.6 K/uL (ref 4.0–10.5)
nRBC: 0 % (ref 0.0–0.2)

## 2023-11-15 LAB — COMPREHENSIVE METABOLIC PANEL WITH GFR
ALT: 53 U/L — ABNORMAL HIGH (ref 0–44)
AST: 97 U/L — ABNORMAL HIGH (ref 15–41)
Albumin: 2.4 g/dL — ABNORMAL LOW (ref 3.5–5.0)
Alkaline Phosphatase: 49 U/L (ref 38–126)
Anion gap: 12 (ref 5–15)
BUN: 30 mg/dL — ABNORMAL HIGH (ref 6–20)
CO2: 15 mmol/L — ABNORMAL LOW (ref 22–32)
Calcium: 6.8 mg/dL — ABNORMAL LOW (ref 8.9–10.3)
Chloride: 116 mmol/L — ABNORMAL HIGH (ref 98–111)
Creatinine, Ser: 2.25 mg/dL — ABNORMAL HIGH (ref 0.61–1.24)
GFR, Estimated: 38 mL/min — ABNORMAL LOW (ref >60)
Glucose, Bld: 104 mg/dL — ABNORMAL HIGH (ref 70–99)
Potassium: 4.1 mmol/L (ref 3.5–5.1)
Sodium: 143 mmol/L (ref 135–145)
Total Bilirubin: 2.6 mg/dL — ABNORMAL HIGH (ref 0.0–1.2)
Total Protein: 4.3 g/dL — ABNORMAL LOW (ref 6.5–8.1)

## 2023-11-15 LAB — MAGNESIUM: Magnesium: 1.7 mg/dL (ref 1.7–2.4)

## 2023-11-15 LAB — LACTIC ACID, PLASMA: Lactic Acid, Venous: 5.6 mmol/L (ref 0.5–1.9)

## 2023-11-15 LAB — TRIGLYCERIDES: Triglycerides: 71 mg/dL

## 2023-11-15 SURGERY — LAPAROTOMY, EXPLORATORY
Anesthesia: General

## 2023-11-15 MED ORDER — MORPHINE BOLUS VIA INFUSION
5.0000 mg | INTRAVENOUS | Status: DC | PRN
  Administered 2023-11-15: 5 mg via INTRAVENOUS

## 2023-11-15 MED ORDER — SODIUM CHLORIDE 0.9 % IV BOLUS
1000.0000 mL | Freq: Once | INTRAVENOUS | Status: AC
  Administered 2023-11-15: 1000 mL via INTRAVENOUS

## 2023-11-15 MED ORDER — ACETAMINOPHEN 325 MG PO TABS
650.0000 mg | ORAL_TABLET | Freq: Four times a day (QID) | ORAL | Status: DC | PRN
Start: 2023-11-15 — End: 2023-11-15

## 2023-11-15 MED ORDER — ACETAMINOPHEN 650 MG RE SUPP: 650.0000 mg | Freq: Four times a day (QID) | RECTAL | Status: DC | PRN

## 2023-11-15 MED ORDER — FENTANYL CITRATE (PF) 100 MCG/2ML IJ SOLN: 100.0000 ug | INTRAMUSCULAR | Status: DC | PRN

## 2023-11-15 MED ORDER — HYDROCORTISONE SOD SUC (PF) 100 MG IJ SOLR
100.0000 mg | Freq: Two times a day (BID) | INTRAMUSCULAR | Status: DC
  Administered 2023-11-15: 100 mg via INTRAVENOUS
  Filled 2023-11-15: qty 2

## 2023-11-15 MED ORDER — MIDAZOLAM HCL 2 MG/2ML IJ SOLN
4.0000 mg | INTRAMUSCULAR | Status: DC
  Administered 2023-11-15 (×2): 4 mg via INTRAVENOUS
  Filled 2023-11-15 (×2): qty 4

## 2023-11-15 MED ORDER — GLYCOPYRROLATE 1 MG PO TABS
1.0000 mg | ORAL_TABLET | ORAL | Status: DC | PRN
Start: 2023-11-15 — End: 2023-11-15

## 2023-11-15 MED ORDER — SODIUM CHLORIDE 0.9% FLUSH: 3.0000 mL | Freq: Two times a day (BID) | INTRAVENOUS | Status: DC

## 2023-11-15 MED ORDER — MORPHINE 100MG IN NS 100ML (1MG/ML) PREMIX INFUSION
0.0000 mg/h | INTRAVENOUS | Status: DC
  Administered 2023-11-15: 5 mg/h via INTRAVENOUS
  Filled 2023-11-15: qty 100

## 2023-11-15 MED ORDER — POLYVINYL ALCOHOL 1.4 % OP SOLN
1.0000 [drp] | Freq: Four times a day (QID) | OPHTHALMIC | Status: DC | PRN
Start: 2023-11-15 — End: 2023-11-15

## 2023-11-15 MED ORDER — GLYCOPYRROLATE 0.2 MG/ML IJ SOLN: 0.2000 mg | INTRAMUSCULAR | Status: DC | PRN

## 2023-11-15 MED ORDER — GLYCOPYRROLATE 0.2 MG/ML IJ SOLN
0.2000 mg | INTRAMUSCULAR | Status: DC | PRN
  Administered 2023-11-15: 0.2 mg via INTRAVENOUS
  Filled 2023-11-15: qty 1

## 2023-11-15 MED ORDER — SODIUM CHLORIDE 0.9% FLUSH: 3.0000 mL | INTRAVENOUS | Status: DC | PRN

## 2023-11-17 ENCOUNTER — Encounter: Payer: Self-pay | Admitting: Surgery

## 2023-11-18 LAB — SURGICAL PATHOLOGY

## 2023-11-19 LAB — CULTURE, BLOOD (ROUTINE X 2)
Culture: NO GROWTH
Culture: NO GROWTH
Special Requests: ADEQUATE

## 2023-12-05 NOTE — Plan of Care (Signed)
  Problem: Clinical Measurements: Goal: Ability to maintain clinical measurements within normal limits will improve Outcome: Not Progressing Goal: Will remain free from infection Outcome: Not Progressing Goal: Diagnostic test results will improve Outcome: Not Progressing Goal: Respiratory complications will improve Outcome: Progressing Goal: Cardiovascular complication will be avoided Outcome: Progressing   Problem: Elimination: Goal: Will not experience complications related to urinary retention Outcome: Progressing   Problem: Elimination: Goal: Will not experience complications related to urinary retention Outcome: Progressing   Problem: Safety: Goal: Ability to remain free from injury will improve Outcome: Progressing   Problem: Skin Integrity: Goal: Risk for impaired skin integrity will decrease Outcome: Progressing

## 2023-12-05 NOTE — Progress Notes (Signed)
 Patient extubated to room air per Dr. Isaiah order. No issues with extubation. Many family members/ friends at bedside during extubation.

## 2023-12-05 NOTE — Progress Notes (Signed)
 Pt made comfortable and extubated. Medications administered prn and continuous per comfort orders. Pt passed away peacefully with family at bedside at 1455. MD notified. Medical devices removed per mothers request. Mother given bath supplies per her request so that she can bathe her son.

## 2023-12-05 NOTE — Discharge Summary (Signed)
 DEATH SUMMARY   Patient Details  Name: Bradley Butler MRN: [id number] DOB: 08-11-1985 ERE:Yzimprx, Lynwood, MD  Admission/Discharge Information   Admit Date:  12/01/23  Date of Death: Date of Death: 12/02/23  Time of Death: Time of Death: 1455  Length of Stay: 1   Principle Cause of death: sepsis secondary to bowel ischemia  Hospital Diagnoses: Principal Problem:   SBO (small bowel obstruction) (HCC) Active Problems:   Small bowel ischemia (HCC)   Ischemic necrosis of small bowel [facility])   Hospital Course: Admitted for above.  Underwent emergent damage control exploratory laparotomy, bowel resection.  Left in discontinuity and transferred to ICU due to critical condition.  Please see Op note for details. Plan was for resuscitation and take back second look surgery following day.  Overnight, continued to require increase pressor support. Reviewed labs from this morning indicating worsening liver as well as kidney function.  Patient remaine critically ill, with pressor support greater at this point then index operation.  Had extensive discussion with family at bedside regarding very high chance of mortality again if we proceed with additional surgeries, and even if he does survive the second operation, the likelihood of a very prolonged recovery process including need for continued ICU care, possible initiation of dialysis, and very high likelihood of complications such as anastomotic leak, poor wound healing.   Mother at bedside stated that she wants to focus on making sure patient is comfortable, would not like to proceed with any additional interventions if the prognosis is so poor.  Decision made as a team to continue supportive care until other family members members are able to visit, and switch over to comfort care whenever family is ready.   Discussion completed with ICU attending in the room as well.  All questions and concerns addressed.  Transition to comfort care  initiated. Extubation note documented at 1405.  Time of death pronounced at 1455.   Assessment and Plan:  Ischemic bowel and septic shock- comfort care transition as above.  Procedures: exploratory laparotomy, small bowel resection, abthera wound vac placement.  Consultations: ICU  The results of significant diagnostics from this hospitalization (including imaging, microbiology, ancillary and laboratory) are listed below for reference.   Significant Diagnostic Studies:  CT ABDOMEN PELVIS W CONTRAST Result Date: 12/01/2023 CLINICAL DATA:  Acute abdominal pain, nausea, vomiting, diarrhea, history of prior G-tube in Nissen fundoplication as a child, history of cerebral palsy EXAM: CT ABDOMEN AND PELVIS WITH CONTRAST TECHNIQUE: Multidetector CT imaging of the abdomen and pelvis was performed using the standard protocol following bolus administration of intravenous contrast. RADIATION DOSE REDUCTION: This exam was performed according to the departmental dose-optimization program which includes automated exposure control, adjustment of the mA and/or kV according to patient size and/or use of iterative reconstruction technique. CONTRAST:  OMNIPAQUE  IOHEXOL  300 MG/ML  SOLN COMPARISON:  July 21, 2018 ultrasound FINDINGS: Lower chest: Unremarkable Hepatobiliary: Branching locules of gas concerning for portal venous gas. Pancreas: Unremarkable. Spleen: Unremarkable. Adrenals/Urinary Tract: Adrenal glands are unremarkable. Small left renal cyst an additional left renal hypodensities are too small to characterize. No hydronephrosis. No nephrolithiasis. Stomach/Bowel: Multiple dilated loops of small bowel and stomach with focal transition point at the level of the terminal ileum concerning for distal small bowel obstruction. The colon is relatively decompressed. Small locules of air visualized in the superior mesenteric vein with findings concerning for portal venous gas concerning for bowel ischemia. No  definite bowel wall pneumatosis. Vascular/Lymphatic: Portal vein, splenic vein, and  SMV are patent with portal venous gas. Reproductive: Unremarkable. Other: No abdominal wall hernia or abnormality. No abdominopelvic ascites. Musculoskeletal: No acute osseous findings. IMPRESSION: 1. Findings concerning for distal small bowel obstruction with transition point at the level of the terminal ileum. No definite bowel wall pneumatosis, although there is portal venous gas, concerning for bowel ischemia. Recommend surgical consultation. These results were called by telephone at the time of interpretation on 11/14/2023 at 12:14 pm to provider [facility] , who verbally acknowledged these results. Electronically Signed   By: Michaeline Blanch M.D.   On: 11/14/2023 12:27    Microbiology: Recent Results (from the past 240 hours)  MRSA Next Gen by PCR, Nasal     Status: None   Collection Time: 11/14/23  3:29 PM   Specimen: Nasal Mucosa; Nasal Swab  Result Value Ref Range Status   MRSA by PCR Next Gen NOT DETECTED NOT DETECTED Final    Comment: (NOTE) The GeneXpert MRSA Assay (FDA approved for NASAL specimens only), is one component of a comprehensive MRSA colonization surveillance program. It is not intended to diagnose MRSA infection nor to guide or monitor treatment for MRSA infections. Test performance is not FDA approved in patients less than 46 years old. Performed at [facility], [address] [place], [place] [address]   Blood culture (routine x 2)     Status: None (Preliminary result)   Collection Time: 11/14/23  3:52 PM   Specimen: BLOOD  Result Value Ref Range Status   Specimen Description BLOOD LEFT ANTECUBITAL  Final   Special Requests   Final    BOTTLES DRAWN AEROBIC AND ANAEROBIC Blood Culture adequate volume   Culture   Final    NO GROWTH < 24 HOURS Performed at [facility], [address], [place], [place] [address]    Report Status PENDING  Incomplete  Blood  culture (routine x 2)     Status: None (Preliminary result)   Collection Time: 11/14/23  5:17 PM   Specimen: BLOOD  Result Value Ref Range Status   Specimen Description BLOOD BLOOD RIGHT ARM  Final   Special Requests   Final    BOTTLES DRAWN AEROBIC AND ANAEROBIC Blood Culture results may not be optimal due to an inadequate volume of blood received in culture bottles   Culture   Final    NO GROWTH < 12 HOURS Performed at [facility], [address] [place], [place] [address]    Report Status PENDING  Incomplete    Time spent: >30 minutes  Signed: Henriette Pierre, DO 12/06/2023

## 2023-12-05 NOTE — Progress Notes (Signed)
   12-03-2023 1300  Spiritual Encounters  Type of Visit Initial  Care provided to: Pt and family  Conversation partners present during encounter Nurse  Referral source Family  Reason for visit End-of-life  Spiritual Framework  Presenting Themes Significant life change  Community/Connection Family;Friend(s);Faith community  Patient Stress Factors Major life changes  Family Stress Factors Major life changes  Interventions  Spiritual Care Interventions Made Established relationship of care and support;Reflective listening;Compassionate presence;Normalization of emotions;Prayer  Intervention Outcomes  Outcomes Awareness of support;Patient family open to resources  Spiritual Care Plan  Spiritual Care Issues Still Outstanding Chaplain will continue to follow   Chaplain responded to page as patient is moving to comfort care. Chaplain provided a compassionate presence and reflective listening as family and friends surrounded the patient's bedside. Family and friends shared stories of patient and was comforted with memories of his life. Patient moved arms as mother was speaking in his ear to him. They are at peace with their decision to move to comfort care measures. Chaplain provided prayer as requested by the family. Chaplain will continue to follow.

## 2023-12-05 NOTE — IPAL (Signed)
  Interdisciplinary Goals of Care Family Meeting   Date carried out: Nov 19, 2023  Location of the meeting: Bedside  Member's involved: Physician, Bedside Registered Nurse, Family Member or next of kin, and Other: Dr Tye    GOALS OF CARE DISCUSSION  The Clinical status was relayed to family in detail- Mother at Bedside  Updated and notified of patients medical condition- Patient remains unresponsive and will not open eyes to command.   Patient with increased WOB and using accessory muscles to breathe Explained to family course of therapy and the modalities  Patient with Progressive multiorgan failure with a very high probablity of a very minimal chance of meaningful recovery despite all aggressive and optimal medical therapy.    Family understands the situation. Significant change in last 24 hrs, severe shock and worsening kidney failure Patient is in the dying process  They have consented and agreed to DNR/DNI status and would like to proceed with Comfort care measures when all family finishes visiting  Family are satisfied with Plan of action and management. All questions answered  Additional CC time 45 mins   Sayge Brienza Alm Cellar, M.D.  [facility]  Medical Director [facility] Medical Director [facility] Cardio-Pulmonary Department

## 2023-12-05 NOTE — Progress Notes (Addendum)
 Per pts mother Ronal, as well as the Neurosurgeon, okay to [employer] copy of funeral release form to [employer].

## 2023-12-05 NOTE — Anesthesia Postprocedure Evaluation (Signed)
 Anesthesia Post Note  Patient: BENOIT MEECH  Procedure(s) Performed: LAPAROTOMY, EXPLORATORY (Abdomen) EXCISION, SMALL INTESTINE (Abdomen)  Patient location during evaluation: SICU Anesthesia Type: General Level of consciousness: sedated Vital Signs Assessment: vitals unstable Respiratory status: patient remains intubated per anesthesia plan Cardiovascular status: unstable and tachycardic Anesthetic complications: no Comments: Patient remains unstable on significant pressor.  Given current state the family is discussing placing the patient on comfort care.   No notable events documented.   Last Vitals:  Vitals:   12-05-2023 0930 12-05-23 0945  BP: 94/63 96/67  Pulse: (!) 124 (!) 127  Resp: 11 16  Temp:    SpO2: 97% 97%    Last Pain:  Vitals:   December 05, 2023 0715  TempSrc: Axillary  PainSc:                  Prentice Murphy

## 2023-12-05 NOTE — Progress Notes (Signed)
 NAME:  Bradley Butler, MRN:  [id number], DOB:  1986-02-09, LOS: 1 ADMISSION DATE:  11/14/2023  CHIEF COMPLAINT:   RESP FAILURE    History of Present Illness:  38 y.o. male past medical history significant for cerebral palsy, asthma, seizure disorder on Vimpat, OSA, who presents to the emergency department with abdominal pain.    Abdominal pain that started last night with nausea and vomiting.   Small amount of bowel movement prior to arrival.  Mother states that he has had issues with constipation in the past but has never had a distended abdomen or small bowel obstruction in the past.  Had a Niesen fundoplication as an infant and prior G-tubes that have thus been removed.  No fever or chills.  Patient found to have  acute ABD with free air acute BOWEL ischemia Patient taken to OR emergently for ex-lap, open wound with wound vac Transferred to ICU for post op resp failure due to shock and sepsis and metabolic acidosis  Significant Hospital Events: Including procedures, antibiotic start and stop dates in addition to other pertinent events   7/11 admitted for acute sepsis/abd perf with abd ischemia s/p ex lap Dec 01, 2023 severe septic shock on pressors, worsening renal failure      Micro Data:  pending  Antimicrobials:   Antibiotics Given (last 72 hours)     Date/Time Action Medication Dose Rate   11/14/23 1242 New Bag/Given   piperacillin -tazobactam (ZOSYN ) IVPB 3.375 g 3.375 g 100 mL/hr   11/14/23 2027 New Bag/Given   piperacillin -tazobactam (ZOSYN ) IVPB 3.375 g 3.375 g 12.5 mL/hr   12/01/23 0336 New Bag/Given   piperacillin -tazobactam (ZOSYN ) IVPB 3.375 g 3.375 g 12.5 mL/hr        EVENTS OVERNIGHT Remains critically ill Remains intubated Requires VENT support for survival Worsening acidosis Worsening renal failure     Objective   Blood pressure 96/60, pulse (!) 120, temperature 98.9 F (37.2 C), temperature source Oral, resp. rate 12, height 5' 2 (1.575 m),  weight 69.9 kg, SpO2 97%.    Vent Mode: PRVC FiO2 (%):  [36 %-60 %] 36 % Set Rate:  [18 bmp] 18 bmp Vt Set:  [500 mL] 500 mL PEEP:  [5 cmH20] 5 cmH20 Plateau Pressure:  [21 cmH20] 21 cmH20   Intake/Output Summary (Last 24 hours) at 12-01-23 0731 Last data filed at 12-01-2023 0600 Gross per 24 hour  Intake 8759.95 ml  Output 1650 ml  Net 7109.95 ml   Filed Weights   11/14/23 0942  Weight: 69.9 kg     REVIEW OF SYSTEMS  PATIENT IS UNABLE TO PROVIDE COMPLETE REVIEW OF SYSTEMS DUE TO SEVERE CRITICAL ILLNESS   PHYSICAL EXAMINATION:  GENERAL:critically ill appearing, +resp distress EYES: Pupils equal, round, reactive to light.  No scleral icterus.  MOUTH: Moist mucosal membrane. INTUBATED NECK: Supple.  PULMONARY: Lungs clear to auscultation, +rhonchi, +wheezing CARDIOVASCULAR: S1 and S2.  Regular rate and rhythm GASTROINTESTINAL: distended open abd with wound vac in place MUSCULOSKELETAL: No swelling, clubbing, or edema.  NEUROLOGIC: obtunded,sedated SKIN:normal, warm to touch, Capillary refill delayed  Pulses present bilaterally   Labs/imaging that I havepersonally reviewed  (right click and Reselect all SmartList Selections daily)      ASSESSMENT AND PLAN SYNOPSIS  38 yo male with acute abd ischemia and perforation with post op resp failure due to septic shock and severe metabolic acidosis    Severe ACUTE Hypoxic and Hypercapnic Respiratory Failure -continue Mechanical Ventilator support -Wean Fio2 and PEEP as tolerated -VAP/VENT bundle  implementation - Wean PEEP & FiO2 as tolerated, maintain SpO2 > 88% - Head of bed elevated 30 degrees, VAP protocol in place - Plateau pressures less than 30 cm H20  - Intermittent chest x-ray & ABG PRN - Ensure adequate pulmonary hygiene   Vent Mode: PRVC FiO2 (%):  [36 %-60 %] 36 % Set Rate:  [18 bmp] 18 bmp Vt Set:  [500 mL] 500 mL PEEP:  [5 cmH20] 5 cmH20 Plateau Pressure:  [21 cmH20] 21 cmH20  CARDIAC ICU  monitoring  ACUTE KIDNEY INJURY/Renal Failure -continue Foley Catheter-assess need -Avoid nephrotoxic agents -Follow urine output, BMP -Ensure adequate renal perfusion, optimize oxygenation -Renal dose medications   Intake/Output Summary (Last 24 hours) at 2023/12/02 0734 Last data filed at Dec 02, 2023 0600 Gross per 24 hour  Intake 8759.95 ml  Output 1650 ml  Net 7109.95 ml      Latest Ref Rng & Units 12/02/23    2:50 AM 11/14/2023    9:40 PM 11/14/2023    9:48 AM  BMP  Glucose 70 - 99 mg/dL 895  866  790   BUN 6 - 20 mg/dL 30  29  29    Creatinine 0.61 - 1.24 mg/dL 7.74  8.25  8.52   Sodium 135 - 145 mmol/L 143  140  141   Potassium 3.5 - 5.1 mmol/L 4.1  4.3  3.8   Chloride 98 - 111 mmol/L 116  116  102   CO2 22 - 32 mmol/L 15  16  21    Calcium  8.9 - 10.3 mg/dL 6.8  7.3  89.8     NEUROLOGY ACUTE METABOLIC ENCEPHALOPATHY -need for sedation -Goal RASS -2 to -3   SEPTIC SHOCK SOURCE-acute ABD perforated bowel ischemia -use vasopressors to keep MAP>65 as needed Patient on several pressors -follow ABG and LA -follow up cultures -emperic ABX   INFECTIOUS DISEASE -continue antibiotics as prescribed -follow up cultures   ENDO - ICU hypoglycemic\Hyperglycemia protocol -check FSBS per protocol   GI GI PROPHYLAXIS as indicated  ELECTROLYTES -follow labs as needed -replace as needed -pharmacy consultation and following  RESTRICTIVE TRANSFUSION PROTOCOL TRANSFUSION  IF HGB<7  or ACTIVE BLEEDING OR DX of ACUTE CORONARY SYNDROMES     Best practice (right click and Reselect all SmartList Selections daily)  Diet:  NPO Pain/Anxiety/Delirium protocol (if indicated): Yes (RASS goal -3) VAP protocol (if indicated): Yes DVT prophylaxis: Subcutaneous Heparin GI prophylaxis: PPI Arterial line:  Yes, and it is still needed Foley:  Yes, and it is still needed Mobility:  bed rest  Code Status: DNR status Disposition: ICU  Labs   CBC: Recent Labs  Lab  11/14/23 0948 12-02-2023 0250  WBC 11.0* 5.6  HGB 20.1* 16.4  HCT 60.4* 50.3  MCV 89.5 93.3  PLT 256 143*    Basic Metabolic Panel: Recent Labs  Lab 11/14/23 0948 11/14/23 2140 2023/12/02 0250  NA 141 140 143  K 3.8 4.3 4.1  CL 102 116* 116*  CO2 21* 16* 15*  GLUCOSE 209* 133* 104*  BUN 29* 29* 30*  CREATININE 1.47* 1.74* 2.25*  CALCIUM  10.1 7.3* 6.8*  MG  --   --  1.7   GFR: Estimated Creatinine Clearance: 38.6 mL/min (A) (by C-G formula based on SCr of 2.25 mg/dL (H)). Recent Labs  Lab 11/14/23 0948 11/14/23 1244 11/14/23 1552 11/14/23 2009 11/14/23 2239 2023/12/02 0250  WBC 11.0*  --   --   --   --  5.6  LATICACIDVEN  --    < >  4.7* 4.5* 4.5* 5.6*   < > = values in this interval not displayed.    Liver Function Tests: Recent Labs  Lab 11/14/23 0948 11/14/23 2140 02-Dec-2023 0250  AST 49* 62* 97*  ALT 83* 49* 53*  ALKPHOS 93 53 49  BILITOT 1.1 2.8* 2.6*  PROT 8.7* 4.7* 4.3*  ALBUMIN  5.2* 2.8* 2.4*   Recent Labs  Lab 11/14/23 0948  LIPASE 65*   No results for input(s): AMMONIA in the last 168 hours.  ABG    Component Value Date/Time   PHART 7.09 (LL) 11/14/2023 1634   PCO2ART 43 11/14/2023 1634   PO2ART 95 11/14/2023 1634   HCO3 13.0 (L) 11/14/2023 1634   ACIDBASEDEF 16.4 (H) 11/14/2023 1634   O2SAT 97.9 11/14/2023 1634     Coagulation Profile: No results for input(s): INR, PROTIME in the last 168 hours.  Cardiac Enzymes: No results for input(s): CKTOTAL, CKMB, CKMBINDEX, TROPONINI in the last 168 hours.  HbA1C: No results found for: HGBA1C  CBG: Recent Labs  Lab 11/14/23 1517  GLUCAP 268*    Allergies Allergies  Allergen Reactions   Levaquin [Levofloxacin In D5w]         DVT/GI PRX  assessed I Assessed the need for Labs I Assessed the need for Foley I Assessed the need for Central Venous Line Family Discussion when available I Assessed the need for Mobilization I made an Assessment of medications to be  adjusted accordingly Safety Risk assessment completed  CASE DISCUSSED IN MULTIDISCIPLINARY ROUNDS WITH ICU TEAM     Critical Care Time devoted to patient care services described in this note is 65 minutes.  Critical care was necessary to treat /prevent imminent and life-threatening deterioration. Overall, patient is critically ill, prognosis is guarded.  Patient with Multiorgan failure and at high risk for cardiac arrest and death.    Nickolas Alm Cellar, M.D.  [facility]  Medical Director [facility] Medical Director [facility] Cardio-Pulmonary Department

## 2023-12-05 NOTE — Progress Notes (Signed)
   2023/11/22 1500  Spiritual Encounters  Type of Visit Follow up   Chaplain followed up with family and care team. Family and friends at bedside while patient passed. Chaplain prayed with family and provided emotional and spiritual support as needed.

## 2023-12-05 NOTE — Progress Notes (Addendum)
 Subjective:  CC: Bradley Butler is a 38 y.o. male  Hospital stay day 1, 1 Day Post-Op ex lap, small bowel resection for ischemic bowel  HPI: Called to bedside by ICU team regarding family wishes to consider comfort care.  Patient continued to require increasing pressor support overnight.  ROS:  Pertinent positives and negatives noted in the HPI.    Objective:   Temp:  [92.5 F (33.6 C)-102 F (38.9 C)] 100.3 F (37.9 C) November 28, 2023 1145) Pulse Rate:  [101-133] 128 11/28/2023 1145) Resp:  [10-37] 17 11-28-2023 1145) BP: (73-203)/(50-139) 86/55 28-Nov-2023 1145) SpO2:  [92 %-100 %] 97 % 11-28-23 1145) Arterial Line BP: (86-156)/(59-83) 86/59 11-28-2023 0715) FiO2 (%):  [36 %-60 %] 36 % 28-Nov-2023 0705)     Height: 5' 2 (157.5 cm) Weight: 69.9 kg BMI (Calculated): 28.16   Intake/Output this shift:   Intake/Output Summary (Last 24 hours) at 11-28-2023 1205 Last data filed at November 28, 2023 1157 Gross per 24 hour  Intake 10467.13 ml  Output 1690 ml  Net 8777.13 ml    Constitutional :  Intubated and sedated  Respiratory:  Clear to auscultation bilaterally  Cardiovascular:  Regular rate and rhythm  Gastrointestinal: ABThera VAC in place.  Draining dark sanguinous fluid.  Patient winces to gentle palpation of the area.   Skin: Cool and moist.  Continues to have lower extremity cyanosis.  Cool to touch.  Psychiatric: Normal affect, non-agitated, not confused       LABS:     Latest Ref Rng & Units 11/28/23    2:50 AM 11/14/2023    9:40 PM 11/14/2023    9:48 AM  CMP  Glucose 70 - 99 mg/dL 895  866  790   BUN 6 - 20 mg/dL 30  29  29    Creatinine 0.61 - 1.24 mg/dL 7.74  8.25  8.52   Sodium 135 - 145 mmol/L 143  140  141   Potassium 3.5 - 5.1 mmol/L 4.1  4.3  3.8   Chloride 98 - 111 mmol/L 116  116  102   CO2 22 - 32 mmol/L 15  16  21    Calcium  8.9 - 10.3 mg/dL 6.8  7.3  89.8   Total Protein 6.5 - 8.1 g/dL 4.3  4.7  8.7   Total Bilirubin 0.0 - 1.2 mg/dL 2.6  2.8  1.1   Alkaline Phos 38 - 126 U/L 49  53   93   AST 15 - 41 U/L 97  62  49   ALT 0 - 44 U/L 53  49  83       Latest Ref Rng & Units 2023/11/28    2:50 AM 11/14/2023    9:48 AM 03/31/2016   10:59 PM  CBC  WBC 4.0 - 10.5 K/uL 5.6  11.0  10.7   Hemoglobin 13.0 - 17.0 g/dL 83.5  79.8  82.9   Hematocrit 39.0 - 52.0 % 50.3  60.4  49.7   Platelets 150 - 400 K/uL 143  256  180     RADS: N/a Assessment:   S/p exploratory laparotomy, small bowel resection, for bowel ischemia, severe septic shock.  Reviewed labs from this morning indicating worsening liver as well as kidney function.  Pressor support has continued to increase overnight as well.  Patient remains critically ill, with pressor support greater at this point then index operation.  Had extensive discussion with family at bedside regarding very high chance of mortality again if we proceed with additional surgeries, and  even if he does survive the second operation, the likelihood of a very prolonged recovery process including need for continued ICU care, possible initiation of dialysis, and very high likelihood of complications such as anastomotic leak, poor wound healing.  Mother at bedside stated that she wants to focus on making sure patient is comfortable, would not like to proceed with any additional interventions if the prognosis is so poor.  Decision made as a team to continue supportive care until other family members members are able to visit, and switch over to comfort care whenever family is ready.  Discussion completed with ICU attending in the room as well.  All questions and concerns addressed at this point.  Surgery will be available if any changes in family wishes.  labs/images/medications/previous chart entries reviewed personally and relevant changes/updates noted above.

## 2023-12-05 NOTE — Progress Notes (Signed)
   Nov 16, 2023 1400  Spiritual Encounters  Type of Visit Follow up   Chaplain followed up with family after extubation. Lots of family and friend support at bedside. Chaplain provided a compassionate presence and reflective listening as they told stories of the patient's life. Chaplain will continue to follow.

## 2023-12-05 DEATH — deceased
